# Patient Record
Sex: Female | Born: 1955
Health system: Southern US, Community
[De-identification: ages and names within clinical notes are randomized; demographics above are authoritative.]

## PROBLEM LIST (undated history)

## (undated) DIAGNOSIS — F329 Major depressive disorder, single episode, unspecified: Secondary | ICD-10-CM

## (undated) DIAGNOSIS — Z77120 Contact with and (suspected) exposure to mold (toxic): Secondary | ICD-10-CM

## (undated) DIAGNOSIS — F988 Other specified behavioral and emotional disorders with onset usually occurring in childhood and adolescence: Secondary | ICD-10-CM

## (undated) DIAGNOSIS — E785 Hyperlipidemia, unspecified: Secondary | ICD-10-CM

## (undated) DIAGNOSIS — H269 Unspecified cataract: Secondary | ICD-10-CM

## (undated) DIAGNOSIS — T7840XA Allergy, unspecified, initial encounter: Secondary | ICD-10-CM

## (undated) DIAGNOSIS — G2581 Restless legs syndrome: Secondary | ICD-10-CM

## (undated) DIAGNOSIS — I10 Essential (primary) hypertension: Secondary | ICD-10-CM

## (undated) DIAGNOSIS — F419 Anxiety disorder, unspecified: Secondary | ICD-10-CM

## (undated) DIAGNOSIS — K589 Irritable bowel syndrome without diarrhea: Secondary | ICD-10-CM

## (undated) DIAGNOSIS — F32A Depression, unspecified: Secondary | ICD-10-CM

## (undated) HISTORY — DX: Major depressive disorder, single episode, unspecified: F32.9

## (undated) HISTORY — DX: Depression, unspecified: F32.A

## (undated) HISTORY — DX: Hyperlipidemia, unspecified: E78.5

## (undated) HISTORY — PX: ABDOMINAL HYSTERECTOMY: SHX81

## (undated) HISTORY — DX: Essential (primary) hypertension: I10

## (undated) HISTORY — DX: Contact with and (suspected) exposure to mold (toxic): Z77.120

## (undated) HISTORY — DX: Allergy, unspecified, initial encounter: T78.40XA

## (undated) HISTORY — PX: RHINOPLASTY: SUR1284

## (undated) HISTORY — DX: Unspecified cataract: H26.9

## (undated) HISTORY — DX: Irritable bowel syndrome without diarrhea: K58.9

## (undated) HISTORY — DX: Anxiety disorder, unspecified: F41.9

## (undated) HISTORY — DX: Other specified behavioral and emotional disorders with onset usually occurring in childhood and adolescence: F98.8

## (undated) HISTORY — DX: Restless legs syndrome: G25.81

---
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
## 1998-04-17 ENCOUNTER — Other Ambulatory Visit: Admission: RE | Admit: 1998-04-17 | Discharge: 1998-04-17 | Payer: Self-pay | Admitting: Obstetrics and Gynecology

## 1999-05-22 ENCOUNTER — Other Ambulatory Visit: Admission: RE | Admit: 1999-05-22 | Discharge: 1999-05-22 | Payer: Self-pay | Admitting: Obstetrics and Gynecology

## 1999-08-08 ENCOUNTER — Encounter: Admission: RE | Admit: 1999-08-08 | Discharge: 1999-11-06 | Payer: Self-pay | Admitting: Internal Medicine

## 2000-01-16 ENCOUNTER — Encounter: Admission: RE | Admit: 2000-01-16 | Discharge: 2000-04-15 | Payer: Self-pay | Admitting: Internal Medicine

## 2000-01-31 ENCOUNTER — Encounter: Admission: RE | Admit: 2000-01-31 | Discharge: 2000-01-31 | Payer: Self-pay | Admitting: Internal Medicine

## 2000-01-31 ENCOUNTER — Encounter: Payer: Self-pay | Admitting: Internal Medicine

## 2000-02-18 ENCOUNTER — Encounter: Payer: Self-pay | Admitting: Internal Medicine

## 2000-02-18 ENCOUNTER — Encounter: Admission: RE | Admit: 2000-02-18 | Discharge: 2000-02-18 | Payer: Self-pay | Admitting: Internal Medicine

## 2000-04-01 ENCOUNTER — Other Ambulatory Visit: Admission: RE | Admit: 2000-04-01 | Discharge: 2000-04-01 | Payer: Self-pay | Admitting: Obstetrics and Gynecology

## 2000-05-05 ENCOUNTER — Encounter (INDEPENDENT_AMBULATORY_CARE_PROVIDER_SITE_OTHER): Payer: Self-pay

## 2000-05-05 ENCOUNTER — Inpatient Hospital Stay (HOSPITAL_COMMUNITY): Admission: RE | Admit: 2000-05-05 | Discharge: 2000-05-06 | Payer: Self-pay | Admitting: Obstetrics and Gynecology

## 2004-05-08 ENCOUNTER — Other Ambulatory Visit: Admission: RE | Admit: 2004-05-08 | Discharge: 2004-05-08 | Payer: Self-pay | Admitting: Internal Medicine

## 2005-03-25 HISTORY — PX: FOOT SURGERY: SHX648

## 2005-06-18 ENCOUNTER — Other Ambulatory Visit: Admission: RE | Admit: 2005-06-18 | Discharge: 2005-06-18 | Payer: Self-pay | Admitting: Internal Medicine

## 2006-08-01 ENCOUNTER — Other Ambulatory Visit: Admission: RE | Admit: 2006-08-01 | Discharge: 2006-08-01 | Payer: Self-pay | Admitting: Internal Medicine

## 2007-06-15 ENCOUNTER — Encounter: Admission: RE | Admit: 2007-06-15 | Discharge: 2007-06-15 | Payer: Self-pay | Admitting: Internal Medicine

## 2007-07-01 ENCOUNTER — Ambulatory Visit: Payer: Self-pay | Admitting: Gastroenterology

## 2007-10-07 ENCOUNTER — Telehealth (INDEPENDENT_AMBULATORY_CARE_PROVIDER_SITE_OTHER): Payer: Self-pay | Admitting: *Deleted

## 2007-10-09 ENCOUNTER — Ambulatory Visit: Payer: Self-pay | Admitting: Gastroenterology

## 2007-10-09 LAB — CONVERTED CEMR LAB
ALT: 48 units/L — ABNORMAL HIGH (ref 0–35)
AST: 34 units/L (ref 0–37)
Albumin: 3.9 g/dL (ref 3.5–5.2)
Alkaline Phosphatase: 56 units/L (ref 39–117)
Bilirubin, Direct: 0.1 mg/dL (ref 0.0–0.3)
Total Bilirubin: 0.7 mg/dL (ref 0.3–1.2)
Total Protein: 7.2 g/dL (ref 6.0–8.3)

## 2008-01-19 ENCOUNTER — Ambulatory Visit: Payer: Self-pay | Admitting: Internal Medicine

## 2008-04-07 ENCOUNTER — Ambulatory Visit: Payer: Self-pay | Admitting: Internal Medicine

## 2008-06-27 ENCOUNTER — Ambulatory Visit: Payer: Self-pay | Admitting: Internal Medicine

## 2008-09-29 ENCOUNTER — Ambulatory Visit: Payer: Self-pay | Admitting: Internal Medicine

## 2009-06-30 ENCOUNTER — Ambulatory Visit: Payer: Self-pay | Admitting: Internal Medicine

## 2009-08-17 ENCOUNTER — Ambulatory Visit: Payer: Self-pay | Admitting: Internal Medicine

## 2009-09-22 ENCOUNTER — Encounter: Admission: RE | Admit: 2009-09-22 | Discharge: 2009-09-22 | Payer: Self-pay | Admitting: Internal Medicine

## 2009-10-09 ENCOUNTER — Ambulatory Visit: Payer: Self-pay | Admitting: Internal Medicine

## 2009-11-23 ENCOUNTER — Ambulatory Visit: Payer: Self-pay | Admitting: Internal Medicine

## 2010-08-07 NOTE — Letter (Signed)
July 01, 2007    Sharlet Salina, MD  [address]  [place], [place]  [address]   RE:  DERONDA, CHRISTIAN  MRN:  [id number]  /  DOB:  December 29, 1955   Dear Dr. Lenord Fellers:   Upon your kind referral, I had the pleasure of evaluating your patient  and I am pleased to offer my findings. I saw Ms. Fairbank in the office  today. Enclosed is a copy of my progress note that details my findings  and recommendations.   Thank you for the opportunity to participate in your patient's care.    Sincerely,      Barbette Hair. Arlyce Dice, MD,FACG  Electronically Signed    RDK/MedQ  Shawnia: 07/01/2007  DT: 07/02/2007  Job #: [id number]

## 2010-08-07 NOTE — Letter (Signed)
July 01, 2007    Cuca Jackey Loge   RE:  Crystal Blake, Tortora Davionna  MRN:  [id number]  /  DOB:  1955/07/19   Dear Ms. Stegmann:   It is my pleasure to have treated you recently as a new patient in my  office. I appreciate your confidence and the opportunity to participate  in your care.   Since I do have a busy inpatient endoscopy schedule and office schedule,  my office hours vary weekly. I am, however, available for emergency  calls every day through my office. If I cannot promptly meet an urgent  office appointment, another one of our gastroenterologists will be able  to assist you.   My well-trained staff are prepared to help you at all times. For  emergencies after office hours, a physician from our gastroenterology  section is always available through my 24-hour answering service.   While you are under my care, I encourage discussion of your questions  and concerns, and I will be happy to return your calls as soon as I am  available.   Once again, I welcome you as a new patient and I look forward to a happy  and healthy relationship.    Sincerely,      Barbette Hair. Arlyce Dice, MD,FACG  Electronically Signed   RDK/MedQ  Shontel: 07/01/2007  DT: 07/02/2007  Job #: [id number]

## 2010-08-07 NOTE — Assessment & Plan Note (Signed)
[facility]                         GASTROENTEROLOGY OFFICE NOTE   NAME:Pedro, Nandita Talmadge Coventry                    MRN:          [id number]  DATE:07/01/2007                            DOB:          05-25-1955    REASON FOR CONSULTATION:  Abnormal liver tests and ultrasound.   HISTORY:  Ms. Dumais is a 55 year old white female referred through the  courtesy of Dr. Lenord Fellers for evaluation.  Abnormal liver tests on routine  exam prompted an ultrasound that demonstrated hepatomegaly with fatty  infiltration of the liver.  Liver measured 22 cm.  Ms. Linz has no  history of liver disease, jaundice, hepatitis or IV drug use.  She does  drink wine several nights a week and at times may have up to a bottle of  wine or at least several glasses.  She has also gained weight over the  last several years.  She denies abdominal pain, pruritus or jaundice.  She does complain of severe fatigue.   PAST MEDICAL HISTORY:  1. Hypertension.  2. Hypercholesterolemia.  3. Anxiety and depression.  4. She is status post hysterectomy.   FAMILY HISTORY:  Pertinent for mother with ovarian cancer.   MEDICATIONS:  1. Atenolol.  2. Amphetamine.   ALLERGIES:  NONE.   SOCIAL HISTORY:  She does not smoke.  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   REVIEW OF SYSTEMS:  Positive for fatigue and back pain, otherwise  systems are negative.   PHYSICAL EXAMINATION:  GENERAL:  She is a healthy-appearing female.  There is no stigmata of liver disease.  VITAL SIGNS:  Pulse 96, blood pressure 124/82, weight 186.  HEENT:  EOMI.  PERRLA.  Sclerae are anicteric.  Conjunctivae are pink.  NECK:  Supple without thyromegaly, adenopathy or carotid bruits.  CHEST:  Clear to auscultation and percussion without adventitious  sounds.  CARDIAC:  Regular rhythm; normal S1 S2.  There are no murmurs, gallops  or rubs.  ABDOMEN:  Liver is palpable at the right costal margin.  Hyperacusis to  approximately 15 cm.  There are no abdominal masses or splenomegaly.  The remainder of the exam is normal.  EXTREMITIES:  Full range of motion.  No cyanosis, clubbing or edema.  RECTAL:  Deferred.  SKELETAL:  There are no deformities.  NEUROLOGIC:  There is no asterixis.  She is alert and oriented x3.  There are no focal abnormalities.   DIAGNOSTICS:  Review of the ultrasound demonstrates an echogenic liver  with diffuse hepatic enlargement measuring 22 cm.   IMPRESSION:  Hepatosteatosis.  Her hepatic enlargement and  hepatosteatosis may be due to a combination of her obesity and alcohol  use.  I believe that she does have significant alcohol intake which may  be contributing if not causing her symptoms of her hepatic enlargement.  Liver enzyme abnormalities (results are not available at this time) are  likely related to this.   RECOMMENDATION:  1. I advised Ms. Santori to discontinue all alcohol intake.  Ideally,      she will lose about 40 pounds.  2. Check iron TIBC, ferritin, AMA, ANA,  alpha I antitrypsin levels,      ceruloplasmin level.  I will check her serologies per Dr. Lenord Fellers.  3. Follow up LFTs in office visit in approximately 3 months.     Barbette Hair. Arlyce Dice, MD,FACG  Electronically Signed    RDK/MedQ  Oda: 07/01/2007  DT: 07/01/2007  Job #: [id number]   cc:   Luanna Cole. Lenord Fellers, M.D.

## 2010-08-10 NOTE — Op Note (Signed)
[facility]  Patient:    Crystal Blake, Crystal Blake                           MRN: [id number] Proc. Date: 05/05/00 Attending:  Aram Beecham P. Ashley Royalty, M.D.                           Operative Report  PREOPERATIVE DIAGNOSES: 1. Menorrhagia and dysmenorrhea unresponsive to medical management. 2. Small uterine fibroid.  POSTOPERATIVE DIAGNOSES: 1. Menorrhagia and dysmenorrhea unresponsive to medical management. 2. Small uterine fibroid.  PROCEDURE:  Total vaginal hysterectomy.  SURGEON:  Cynthia P. Ashley Royalty, M.D.  ASSISTANT:  Andres Ege, M.D.  ANESTHESIA:  General endotracheal anesthesia.  ESTIMATED BLOOD LOSS:  100 cc.  COMPLICATIONS:  None.  DESCRIPTION OF PROCEDURE:  The patient was taken to the operating room and, after the induction of adequate general endotracheal anesthesia, was placed in the dorsolithotomy position and prepped and draped in the usual fashion.  The bladder as drained with a red rubber catheter.  The cervix was grasped on its anterior lip with a Jacobs tenaculum, and 10 cc of 1% Xylocaine with epinephrine were injected submucosally.  A knife was used to excise the vaginal mucosa over the cervix, and this was pushed back with a sponge.  A posterior colpotomy incision was made, and the Bonnano retractor placed in the peritoneal space.  The uterosacral ligaments were clamped with Heaney Ballantine, cut, and sutured with #1 Vicryl.  The cardinal ligament was clamped on each side with a Heaney Ballantine, clamping, cutting, and tying in sequence.  The uterine arteries were clamped, cut, and doubly tied with the O Vicryl.  The fundus was then delivered through the posterior cul-de-sac and the pedicle containing the tube, the utero-ovarian ligament, the round ligament was doubly clamped, cut, and doubly tied with 0 Vicryl.  Inspection of the pedicle revealed there to be no bleeding.  The vaginal cuff was then run with 0 Vicryl incorporating  the peritoneum for hemostasis.  An anti-enterocele stitch was then placed by entering the posterior peritoneal space in the midline, grabbing the left uterosacral ligament with the stitch, skimming over the peritoneum, grabbing the right uterosacral ligament, and then coming out again in the midline and tying.  The pedicles were again inspected and were hemostatic.  The surgical site was irrigated, and irrigant was clear.  The vagina was closed with interrupted sutures of 0 Vicryl.  A Foley catheter was inserted, and the procedure was terminated.  The patient tolerated it well and went in satisfactory condition to postanesthesia recovery. Rhaya:  05/05/00 TD:  05/06/00 Job: [id number]

## 2010-10-04 ENCOUNTER — Encounter: Payer: Self-pay | Admitting: Internal Medicine

## 2010-10-05 ENCOUNTER — Encounter: Payer: Self-pay | Admitting: Internal Medicine

## 2010-10-08 ENCOUNTER — Ambulatory Visit (INDEPENDENT_AMBULATORY_CARE_PROVIDER_SITE_OTHER): Payer: 59 | Admitting: Internal Medicine

## 2010-10-08 ENCOUNTER — Encounter: Payer: Self-pay | Admitting: Internal Medicine

## 2010-10-08 DIAGNOSIS — IMO0001 Reserved for inherently not codable concepts without codable children: Secondary | ICD-10-CM

## 2010-10-08 DIAGNOSIS — F988 Other specified behavioral and emotional disorders with onset usually occurring in childhood and adolescence: Secondary | ICD-10-CM

## 2010-10-08 DIAGNOSIS — E785 Hyperlipidemia, unspecified: Secondary | ICD-10-CM

## 2010-10-08 DIAGNOSIS — E669 Obesity, unspecified: Secondary | ICD-10-CM

## 2010-10-08 DIAGNOSIS — I10 Essential (primary) hypertension: Secondary | ICD-10-CM

## 2010-10-08 DIAGNOSIS — M7918 Myalgia, other site: Secondary | ICD-10-CM

## 2010-10-17 ENCOUNTER — Other Ambulatory Visit: Payer: Self-pay | Admitting: Internal Medicine

## 2010-10-22 DIAGNOSIS — M7918 Myalgia, other site: Secondary | ICD-10-CM | POA: Insufficient documentation

## 2010-10-22 DIAGNOSIS — E785 Hyperlipidemia, unspecified: Secondary | ICD-10-CM | POA: Insufficient documentation

## 2010-10-22 DIAGNOSIS — E669 Obesity, unspecified: Secondary | ICD-10-CM | POA: Insufficient documentation

## 2010-10-22 DIAGNOSIS — F988 Other specified behavioral and emotional disorders with onset usually occurring in childhood and adolescence: Secondary | ICD-10-CM | POA: Insufficient documentation

## 2010-10-22 DIAGNOSIS — I10 Essential (primary) hypertension: Secondary | ICD-10-CM | POA: Insufficient documentation

## 2010-10-22 NOTE — Progress Notes (Signed)
  Subjective:    Patient ID: Crystal Blake, female    DOB: [birth year], 55 y.o.   MRN: [id number]  HPI white female with history of asthma, allergic rhinitis, anxiety depression, hyperlipidemia, hypertension. Complains of restless leg syndrome and takes Requip. History of rhinoplasty 1978. Vaginal hysterectomy 2002. Has been tried on Lipitor which she did not tolerate because of musculoskeletal pain. Suggest Crestor. She's under a lot of stress. Husband has had some respiratory issues and has been out of work a lot. Mother low has pancreatic cancer. Her daughter has had some mental issues and was out of school a lot this past year. In September 2011 she had done lipid panel that was essentially normal except for triglycerides of 215. Occasionally has trigger points injected along scapular area. Has been off and on cholesterol medication for years. Has issues centering around organization of her home getting mundane chores done. Has trouble getting up in the morning. This has been going on for years. She has worked previously as a Museum/gallery conservator and works some from home but has not held a steady job other than working from home for several years.  She is very intelligent and attended college at [employer].    Review of Systems     Objective:   Physical Exam chest clear; cardiac exam regular rate and rhythm; extremities without edema        Assessment & Plan:   Hypertension  Hyperlipidemia  Depression  Restless leg syndrome  Attention deficit disorder  Asthma  Allergic rhinitis.  Is asking for mail order prescription for Adderall. This is been prescribed previously by psychiatrist and we have continued her on the signs prescription for Adderall 20 mg 3 times daily. Given #270 with 1 refill. Would like for her to take better care of herself and try the diet exercise and lose weight.

## 2010-11-22 ENCOUNTER — Other Ambulatory Visit: Payer: 59 | Admitting: Internal Medicine

## 2010-11-22 DIAGNOSIS — Z0000 Encounter for general adult medical examination without abnormal findings: Secondary | ICD-10-CM

## 2010-11-22 LAB — CBC WITH DIFFERENTIAL/PLATELET
Basophils Absolute: 0 10*3/uL (ref 0.0–0.1)
Basophils Relative: 1 % (ref 0–1)
HCT: 46.4 % — ABNORMAL HIGH (ref 36.0–46.0)
Hemoglobin: 14.4 g/dL (ref 12.0–15.0)
Lymphocytes Relative: 38 % (ref 12–46)
MCHC: 31 g/dL (ref 30.0–36.0)
Monocytes Absolute: 0.5 10*3/uL (ref 0.1–1.0)
Monocytes Relative: 7 % (ref 3–12)
Neutro Abs: 3.9 10*3/uL (ref 1.7–7.7)
Neutrophils Relative %: 52 % (ref 43–77)
WBC: 7.7 10*3/uL (ref 4.0–10.5)

## 2010-11-22 LAB — LIPID PANEL
Cholesterol: 262 mg/dL — ABNORMAL HIGH (ref 0–200)
HDL: 49 mg/dL (ref >39)
LDL Cholesterol: 140 mg/dL — ABNORMAL HIGH (ref 0–99)
Triglycerides: 365 mg/dL — ABNORMAL HIGH (ref <150)
VLDL: 73 mg/dL — ABNORMAL HIGH (ref 0–40)

## 2010-11-22 LAB — COMPREHENSIVE METABOLIC PANEL
BUN: 16 mg/dL (ref 6–23)
CO2: 25 mEq/L (ref 19–32)
Calcium: 9.7 mg/dL (ref 8.4–10.5)
Chloride: 103 mEq/L (ref 96–112)
Creat: 0.73 mg/dL (ref 0.50–1.10)
Total Bilirubin: 0.5 mg/dL (ref 0.3–1.2)

## 2010-11-22 LAB — TSH: TSH: 1.771 u[IU]/mL (ref 0.350–4.500)

## 2010-11-23 ENCOUNTER — Encounter: Payer: Self-pay | Admitting: Internal Medicine

## 2010-11-23 ENCOUNTER — Ambulatory Visit (INDEPENDENT_AMBULATORY_CARE_PROVIDER_SITE_OTHER): Payer: 59 | Admitting: Internal Medicine

## 2010-11-23 VITALS — BP 156/94 | HR 84 | Temp 99.4°F | Ht 66.25 in | Wt 177.0 lb

## 2010-11-23 DIAGNOSIS — F411 Generalized anxiety disorder: Secondary | ICD-10-CM

## 2010-11-23 DIAGNOSIS — J45909 Unspecified asthma, uncomplicated: Secondary | ICD-10-CM

## 2010-11-23 DIAGNOSIS — E669 Obesity, unspecified: Secondary | ICD-10-CM

## 2010-11-23 DIAGNOSIS — F329 Major depressive disorder, single episode, unspecified: Secondary | ICD-10-CM

## 2010-11-23 DIAGNOSIS — F419 Anxiety disorder, unspecified: Secondary | ICD-10-CM

## 2010-11-23 DIAGNOSIS — F32A Depression, unspecified: Secondary | ICD-10-CM

## 2010-11-23 DIAGNOSIS — I10 Essential (primary) hypertension: Secondary | ICD-10-CM

## 2010-11-23 DIAGNOSIS — E785 Hyperlipidemia, unspecified: Secondary | ICD-10-CM

## 2010-11-23 DIAGNOSIS — G2581 Restless legs syndrome: Secondary | ICD-10-CM

## 2010-11-23 DIAGNOSIS — F988 Other specified behavioral and emotional disorders with onset usually occurring in childhood and adolescence: Secondary | ICD-10-CM

## 2010-11-23 DIAGNOSIS — Z0000 Encounter for general adult medical examination without abnormal findings: Secondary | ICD-10-CM

## 2010-11-23 DIAGNOSIS — J309 Allergic rhinitis, unspecified: Secondary | ICD-10-CM

## 2010-11-23 LAB — POCT URINALYSIS DIPSTICK
Bilirubin, UA: NEGATIVE
Blood, UA: NEGATIVE
Glucose, UA: NEGATIVE
Leukocytes, UA: NEGATIVE
Nitrite, UA: NEGATIVE

## 2010-12-24 ENCOUNTER — Encounter: Payer: Self-pay | Admitting: Internal Medicine

## 2010-12-24 DIAGNOSIS — J309 Allergic rhinitis, unspecified: Secondary | ICD-10-CM | POA: Insufficient documentation

## 2010-12-24 DIAGNOSIS — F329 Major depressive disorder, single episode, unspecified: Secondary | ICD-10-CM | POA: Insufficient documentation

## 2010-12-24 DIAGNOSIS — F32A Depression, unspecified: Secondary | ICD-10-CM | POA: Insufficient documentation

## 2010-12-24 DIAGNOSIS — F419 Anxiety disorder, unspecified: Secondary | ICD-10-CM | POA: Insufficient documentation

## 2010-12-24 DIAGNOSIS — G2581 Restless legs syndrome: Secondary | ICD-10-CM | POA: Insufficient documentation

## 2010-12-24 NOTE — Progress Notes (Signed)
Subjective:    Patient ID: Crystal Blake, female    DOB: Apr 27, 1955, 55 y.o.   MRN: [id number]  HPI 55 year old white female with history of hypertension, asthma, anxiety, depression, restless leg syndrome, hyperlipidemia, thyroid cyst status post rhinoplasty 1978, vaginal hysterectomy 2002 for evaluation of medical problems. Had tetanus immunization 2002. History of attention deficit disorder diagnosed by psychiatrist. Has been a patient in this practice since 1987. Had a suicidal gesture at 55 years of age. Has never dealt well with work stress. Former smoker quit in the late 1980s. History of migraine headache. History of allergic rhinitis. Had benign cyst removed from left upper arm 1972. Had an abortion 1979. Severe depression 1980. Social alcohol consumption.  Father died with liver cancer. Mother with history of hypertension , hyperlipidemia, and alcoholism  Patient has a college degree from [employer] in [place] literature. 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 Difficulty getting out of bed in the morning. Frequently disorganized with chores. Married with 2 daughters. Husband is a Community education officer at [employer] and formerly worked as a Control and instrumentation engineer. They have struggle financially over the past several years. Youngest daughter has had some mental health issues and school truancy issues.    Review of Systems  Constitutional: Positive for fatigue.       Obesity and lack of exercise  HENT: Positive for rhinorrhea and sneezing.   Eyes: Negative.   Respiratory:       History of asthma  Cardiovascular: Negative.   Gastrointestinal: Negative.   Genitourinary: Negative.   Musculoskeletal:       Musculoskeletal pain  Skin: Negative.   Neurological: Negative.   Hematological: Negative.   Psychiatric/Behavioral: Positive for dysphoric mood and decreased concentration. Negative for suicidal ideas. The patient is nervous/anxious.         Objective:   Physical Exam  Vitals reviewed. Constitutional: She is oriented to person, place, and time. She appears well-nourished.  HENT:  Head: Normocephalic and atraumatic.  Right Ear: External ear normal.  Left Ear: External ear normal.  Mouth/Throat: Oropharynx is clear and moist. No oropharyngeal exudate.  Eyes: EOM are normal. Pupils are equal, round, and reactive to light. Right eye exhibits no discharge. Left eye exhibits no discharge. No scleral icterus.  Neck: Neck supple. No JVD present. No thyromegaly present.  Cardiovascular: Normal rate, regular rhythm and normal heart sounds.   No murmur heard. Pulmonary/Chest: Effort normal and breath sounds normal. She has no rales.       Breasts normal female  Abdominal: Soft. Bowel sounds are normal. She exhibits no mass. There is no tenderness.  Genitourinary:       Deferred  Musculoskeletal: Normal range of motion. She exhibits no edema.  Lymphadenopathy:    She has no cervical adenopathy.  Neurological: She is alert and oriented to person, place, and time. She has normal reflexes. No cranial nerve deficit.  Skin: Skin is warm and dry.  Psychiatric: Judgment and thought content normal.          Assessment & Plan:  History of depression  History of asthma  History of allergic rhinitis  History of anxiety  History of attention deficit disorder  History of restless leg syndrome  History of hypertension  History of hyperlipidemia  History of musculoskeletal pain  Patient has not been all that compliant with lipid-lowering medication because of expense. Needs to restart statin medication. Has complained Lipitor caused musculoskeletal  pain. Try Crestor. Patient needs to be saying to 6 months for followup on hyperlipidemia.

## 2010-12-28 ENCOUNTER — Other Ambulatory Visit: Payer: Self-pay

## 2010-12-28 MED ORDER — AMPHETAMINE-DEXTROAMPHETAMINE 20 MG PO TABS: 20.0000 mg | ORAL_TABLET | Freq: Three times a day (TID) | ORAL | Status: DC

## 2011-03-23 ENCOUNTER — Other Ambulatory Visit: Payer: Self-pay | Admitting: Internal Medicine

## 2011-04-02 ENCOUNTER — Other Ambulatory Visit: Payer: Self-pay

## 2011-04-02 MED ORDER — AMPHETAMINE-DEXTROAMPHETAMINE 20 MG PO TABS: 20.0000 mg | ORAL_TABLET | Freq: Three times a day (TID) | ORAL | Status: DC

## 2011-05-30 ENCOUNTER — Other Ambulatory Visit: Payer: 59 | Admitting: Internal Medicine

## 2011-05-30 DIAGNOSIS — E785 Hyperlipidemia, unspecified: Secondary | ICD-10-CM

## 2011-05-30 DIAGNOSIS — Z79899 Other long term (current) drug therapy: Secondary | ICD-10-CM

## 2011-05-30 LAB — HEPATIC FUNCTION PANEL
ALT: 38 U/L — ABNORMAL HIGH (ref 0–35)
AST: 23 U/L (ref 0–37)
Albumin: 4.7 g/dL (ref 3.5–5.2)
Alkaline Phosphatase: 65 U/L (ref 39–117)
Total Protein: 7.2 g/dL (ref 6.0–8.3)

## 2011-05-30 LAB — LIPID PANEL
Cholesterol: 207 mg/dL — ABNORMAL HIGH (ref 0–200)
HDL: 61 mg/dL (ref >39)
Triglycerides: 173 mg/dL — ABNORMAL HIGH (ref <150)

## 2011-05-31 ENCOUNTER — Encounter: Payer: Self-pay | Admitting: Internal Medicine

## 2011-05-31 ENCOUNTER — Ambulatory Visit (INDEPENDENT_AMBULATORY_CARE_PROVIDER_SITE_OTHER): Payer: 59 | Admitting: Internal Medicine

## 2011-05-31 VITALS — BP 124/86 | HR 80 | Temp 98.2°F | Wt 180.0 lb

## 2011-05-31 DIAGNOSIS — R5381 Other malaise: Secondary | ICD-10-CM

## 2011-05-31 DIAGNOSIS — F439 Reaction to severe stress, unspecified: Secondary | ICD-10-CM

## 2011-05-31 DIAGNOSIS — I10 Essential (primary) hypertension: Secondary | ICD-10-CM

## 2011-05-31 DIAGNOSIS — R5383 Other fatigue: Secondary | ICD-10-CM

## 2011-05-31 DIAGNOSIS — E785 Hyperlipidemia, unspecified: Secondary | ICD-10-CM

## 2011-05-31 DIAGNOSIS — G2581 Restless legs syndrome: Secondary | ICD-10-CM

## 2011-05-31 DIAGNOSIS — F988 Other specified behavioral and emotional disorders with onset usually occurring in childhood and adolescence: Secondary | ICD-10-CM

## 2011-05-31 DIAGNOSIS — Z8709 Personal history of other diseases of the respiratory system: Secondary | ICD-10-CM

## 2011-05-31 DIAGNOSIS — Z733 Stress, not elsewhere classified: Secondary | ICD-10-CM

## 2011-06-02 DIAGNOSIS — F439 Reaction to severe stress, unspecified: Secondary | ICD-10-CM | POA: Insufficient documentation

## 2011-06-02 DIAGNOSIS — R5383 Other fatigue: Secondary | ICD-10-CM | POA: Insufficient documentation

## 2011-06-02 NOTE — Progress Notes (Signed)
  Subjective:    Patient ID: Crystal Blake, female    DOB: 1955/08/22, 56 y.o.   MRN: [id number]  HPI Patient with history of hyperlipidemia, hypertension, attention deficit and fatigue for six-month recheck. She is married and has 2 teenage daughters. Youngest daughter is had some issues requiring hospitalization at behavioral health. Daughter is doing much better now; but  had suicidal gesture requiring admission to behavioral health a few months ago. Seems to be doing better at [employer]. Patient's husband works at [employer]. He currently is a Community education officer but will be moving to a position in service department which did give him some more time at home. Patient's mother apparently has had issues with drug addiction and dependence on Vicodin. Was hospitalized and is now in [place] here in [place].    Review of Systems     Objective:   Physical Exam neck supple no thyromegaly; chest clear; cardiac exam regular rate and rhythm; extremities without edema        Assessment & Plan:  Hypertension  Hyper lipidemia  Attention deficit  History of chronic fatigue  Obesity  Chronic situational stress  Plan: Patient is to return in 6 months for physical examination. No changes in medication. Refill Requip for one year.

## 2011-06-02 NOTE — Patient Instructions (Signed)
Continue same medications and return in 6 months. Try to exercise and lose weight.

## 2011-06-14 ENCOUNTER — Other Ambulatory Visit: Payer: Self-pay | Admitting: Internal Medicine

## 2011-06-21 ENCOUNTER — Other Ambulatory Visit: Payer: Self-pay | Admitting: Internal Medicine

## 2011-07-04 ENCOUNTER — Other Ambulatory Visit: Payer: Self-pay

## 2011-07-04 MED ORDER — AMPHETAMINE-DEXTROAMPHETAMINE 20 MG PO TABS: 20.0000 mg | ORAL_TABLET | Freq: Three times a day (TID) | ORAL | Status: DC

## 2011-09-13 ENCOUNTER — Other Ambulatory Visit: Payer: Self-pay | Admitting: Internal Medicine

## 2011-10-03 ENCOUNTER — Other Ambulatory Visit: Payer: Self-pay

## 2011-10-03 MED ORDER — AMPHETAMINE-DEXTROAMPHETAMINE 20 MG PO TABS: 20.0000 mg | ORAL_TABLET | Freq: Three times a day (TID) | ORAL | Status: DC

## 2011-10-10 ENCOUNTER — Encounter: Payer: Self-pay | Admitting: Internal Medicine

## 2011-10-10 ENCOUNTER — Ambulatory Visit (INDEPENDENT_AMBULATORY_CARE_PROVIDER_SITE_OTHER): Payer: 59 | Admitting: Internal Medicine

## 2011-10-10 VITALS — BP 156/100 | HR 88 | Temp 99.0°F | Wt 177.5 lb

## 2011-10-10 DIAGNOSIS — I10 Essential (primary) hypertension: Secondary | ICD-10-CM

## 2011-10-10 DIAGNOSIS — F411 Generalized anxiety disorder: Secondary | ICD-10-CM

## 2011-10-10 DIAGNOSIS — F419 Anxiety disorder, unspecified: Secondary | ICD-10-CM

## 2011-10-10 DIAGNOSIS — M7918 Myalgia, other site: Secondary | ICD-10-CM

## 2011-10-10 DIAGNOSIS — F988 Other specified behavioral and emotional disorders with onset usually occurring in childhood and adolescence: Secondary | ICD-10-CM

## 2011-10-10 DIAGNOSIS — F32A Depression, unspecified: Secondary | ICD-10-CM

## 2011-10-10 DIAGNOSIS — E669 Obesity, unspecified: Secondary | ICD-10-CM

## 2011-10-10 DIAGNOSIS — F430 Acute stress reaction: Secondary | ICD-10-CM

## 2011-10-10 DIAGNOSIS — E785 Hyperlipidemia, unspecified: Secondary | ICD-10-CM

## 2011-10-10 DIAGNOSIS — F439 Reaction to severe stress, unspecified: Secondary | ICD-10-CM

## 2011-10-10 DIAGNOSIS — F329 Major depressive disorder, single episode, unspecified: Secondary | ICD-10-CM

## 2011-10-10 DIAGNOSIS — IMO0001 Reserved for inherently not codable concepts without codable children: Secondary | ICD-10-CM

## 2011-10-14 NOTE — Patient Instructions (Addendum)
Take Tranxene 7.5 mg sparingly for anxiety twice daily. Lexapro 10 mg daily for anxiety depression. Take Vicodin sparingly for pain.

## 2011-10-14 NOTE — Progress Notes (Signed)
  Subjective:    Patient ID: Crystal Blake, female    DOB: 12/08/1955, 56 y.o.   MRN: [id number]  HPI 56 year old white female in today previously diagnosed by Dr. Jodi Marble years ago with attention deficit disorder. She is not hyperactive. She's been on Adderall for many years. She in her family had considerable financial problems over the years. Husband lost his job at the [employer] and began working as a Community education officer. He is now in charge of writing up rep[airs at the same car dealership. Youngest daughter has had some mental issues as well. She cuts herself and is under the care of Dr. Toni Arthurs. Patient says that she worries every month she will not have enough money to make the house payment. Says she always seems anxious. She is to take Tranxene in the remote past which worked well. She has expertise in computers but currently is not working outside the home. 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 Husband recently was charged with DUI.  Also having issues with musculoskeletal pain. Her feet hurt and she is considering having foot surgery in the near future.  History of hypertension, obesity, hyperlipidemia.    Review of Systems     Objective:   Physical Exam neck is supple without thyromegaly. Chest clear to auscultation. Cardiac exam regular rate and rhythm. Patient is alert and oriented x3. Cranial nerves II through XII grossly intact. Affect is slightly anxious. Mood is slightly dysphoric.        Assessment & Plan:  Attention deficit disorder  Anxiety  Probable depression  Obesity  Hypertension-not well controlled today because of anxiety  Hyperlipidemia  Plan: Tranxene 7.5 mg one by mouth twice daily as needed for anxiety  Vicodin 5/500 (#60) 1 by mouth Q8 hours sparingly as needed for foot pain/musculoskeletal pain. One refill given on Vicodin prescription. Recently refilled her Adderall but she has changed prescription mail order companies.  Lexapro  10 mg one tablet daily with 1 refill.  She should consider some low-cost counseling.  Spent 30 minutes talking with patient about these issues

## 2011-12-03 ENCOUNTER — Other Ambulatory Visit: Payer: Self-pay | Admitting: Internal Medicine

## 2011-12-03 ENCOUNTER — Other Ambulatory Visit: Payer: 59 | Admitting: Internal Medicine

## 2011-12-03 DIAGNOSIS — E785 Hyperlipidemia, unspecified: Secondary | ICD-10-CM

## 2011-12-03 DIAGNOSIS — Z0000 Encounter for general adult medical examination without abnormal findings: Secondary | ICD-10-CM

## 2011-12-03 DIAGNOSIS — Z79899 Other long term (current) drug therapy: Secondary | ICD-10-CM

## 2011-12-03 LAB — COMPREHENSIVE METABOLIC PANEL
ALT: 33 U/L (ref 0–35)
Albumin: 4.7 g/dL (ref 3.5–5.2)
CO2: 26 mEq/L (ref 19–32)
Calcium: 9.8 mg/dL (ref 8.4–10.5)
Chloride: 103 mEq/L (ref 96–112)
Glucose, Bld: 97 mg/dL (ref 70–99)
Potassium: 4.9 mEq/L (ref 3.5–5.3)
Sodium: 140 mEq/L (ref 135–145)
Total Bilirubin: 0.6 mg/dL (ref 0.3–1.2)
Total Protein: 7.3 g/dL (ref 6.0–8.3)

## 2011-12-03 LAB — CBC WITH DIFFERENTIAL/PLATELET
Eosinophils Absolute: 0.2 10*3/uL (ref 0.0–0.7)
Hemoglobin: 14.6 g/dL (ref 12.0–15.0)
Lymphocytes Relative: 41 % (ref 12–46)
Lymphs Abs: 3.1 10*3/uL (ref 0.7–4.0)
MCH: 29 pg (ref 26.0–34.0)
Monocytes Relative: 8 % (ref 3–12)
Neutrophils Relative %: 47 % (ref 43–77)
RBC: 5.04 MIL/uL (ref 3.87–5.11)
WBC: 7.6 10*3/uL (ref 4.0–10.5)

## 2011-12-03 LAB — LIPID PANEL
Cholesterol: 193 mg/dL (ref 0–200)
Triglycerides: 296 mg/dL — ABNORMAL HIGH (ref <150)

## 2011-12-04 LAB — VITAMIN D 25 HYDROXY (VIT D DEFICIENCY, FRACTURES): Vit D, 25-Hydroxy: 32 ng/mL (ref 30–89)

## 2011-12-05 ENCOUNTER — Encounter: Payer: Self-pay | Admitting: Internal Medicine

## 2011-12-05 ENCOUNTER — Ambulatory Visit (INDEPENDENT_AMBULATORY_CARE_PROVIDER_SITE_OTHER): Payer: 59 | Admitting: Internal Medicine

## 2011-12-05 VITALS — BP 142/90 | HR 76 | Temp 98.9°F | Ht 67.0 in | Wt 174.0 lb

## 2011-12-05 DIAGNOSIS — I10 Essential (primary) hypertension: Secondary | ICD-10-CM

## 2011-12-05 DIAGNOSIS — Z8709 Personal history of other diseases of the respiratory system: Secondary | ICD-10-CM

## 2011-12-05 DIAGNOSIS — K7689 Other specified diseases of liver: Secondary | ICD-10-CM

## 2011-12-05 DIAGNOSIS — F32A Depression, unspecified: Secondary | ICD-10-CM

## 2011-12-05 DIAGNOSIS — Z23 Encounter for immunization: Secondary | ICD-10-CM

## 2011-12-05 DIAGNOSIS — K760 Fatty (change of) liver, not elsewhere classified: Secondary | ICD-10-CM

## 2011-12-05 DIAGNOSIS — G2581 Restless legs syndrome: Secondary | ICD-10-CM

## 2011-12-05 DIAGNOSIS — F419 Anxiety disorder, unspecified: Secondary | ICD-10-CM

## 2011-12-05 DIAGNOSIS — F341 Dysthymic disorder: Secondary | ICD-10-CM

## 2011-12-05 DIAGNOSIS — F988 Other specified behavioral and emotional disorders with onset usually occurring in childhood and adolescence: Secondary | ICD-10-CM

## 2011-12-05 DIAGNOSIS — E785 Hyperlipidemia, unspecified: Secondary | ICD-10-CM

## 2011-12-05 LAB — POCT URINALYSIS DIPSTICK
Bilirubin, UA: NEGATIVE
Glucose, UA: NEGATIVE
Ketones, UA: NEGATIVE
Leukocytes, UA: NEGATIVE
Nitrite, UA: NEGATIVE
pH, UA: 5.5

## 2011-12-05 LAB — HEMOGLOBIN A1C: Hgb A1c MFr Bld: 5.7 % — ABNORMAL HIGH (ref <5.7)

## 2011-12-06 ENCOUNTER — Other Ambulatory Visit: Payer: Self-pay | Admitting: Internal Medicine

## 2011-12-09 ENCOUNTER — Encounter: Payer: Self-pay | Admitting: Internal Medicine

## 2011-12-09 ENCOUNTER — Ambulatory Visit (INDEPENDENT_AMBULATORY_CARE_PROVIDER_SITE_OTHER): Payer: 59 | Admitting: Internal Medicine

## 2011-12-09 VITALS — BP 138/94 | HR 80 | Temp 98.6°F | Ht 67.0 in | Wt 174.0 lb

## 2011-12-09 DIAGNOSIS — L219 Seborrheic dermatitis, unspecified: Secondary | ICD-10-CM

## 2011-12-09 DIAGNOSIS — L0390 Cellulitis, unspecified: Secondary | ICD-10-CM

## 2011-12-09 DIAGNOSIS — L0291 Cutaneous abscess, unspecified: Secondary | ICD-10-CM

## 2011-12-09 NOTE — Progress Notes (Signed)
  Subjective:    Patient ID: Crystal Blake, female    DOB: [birth year], 56 y.o.   MRN: [id number]  HPI Patient was here last week for physical examination and had cerumen in her ears. I attempted to remove cerumen from both ear canals with curette. She says that yesterday she noticed soreness around her left ear and behind her left ear. She thought it was related to the curetting that was done on 12/05/2011. She has history of seborrhea of external ears. She uses a steroid ointment on those areas. No fever. Says pain is worse when lying on her left side. She says she was worried there could of been some damage to her eardrum from curetting. She even suggested to the front office staff that she should not have to pay a Co-pay because she thought it was related to a curetting injury.    Review of Systems     Objective:   Physical Exam the left external ear canal is free of trauma. Left tympanic membrane is not damaged and is slightly full with good light reflex. What she appears to have is a mild early cellulitis of left external ear along with significant scaling of the left external ear.        Assessment & Plan:  Cellulitis left external ear canal. Has scaly areas on external ear for which she uses cortisone ointment.  Seborrhea left external ear  No evidence of trauma to left tympanic membrane or ear canal  Cerumen left external ear canal  Plan: Cortisporin Otic suspension 4 drops left external ear 4 times a day for 5-7 days. Duricef 500 mg #14 1 by mouth twice a day for 7 days generic. No more attempts will be made to remove cerumen from her ears

## 2011-12-09 NOTE — Patient Instructions (Addendum)
Take Duricef 500 mg twice daily for 7 days. Use Cortisporin Otic suspension in left external ear canal 4 times daily. Call if not better in 48 hours.

## 2012-01-07 ENCOUNTER — Other Ambulatory Visit: Payer: Self-pay

## 2012-01-07 MED ORDER — ATORVASTATIN CALCIUM 40 MG PO TABS: 40.0000 mg | ORAL_TABLET | Freq: Every day | ORAL | Status: DC

## 2012-01-07 MED ORDER — AMPHETAMINE-DEXTROAMPHETAMINE 20 MG PO TABS: 20.0000 mg | ORAL_TABLET | Freq: Three times a day (TID) | ORAL | Status: DC

## 2012-01-13 ENCOUNTER — Other Ambulatory Visit: Payer: Self-pay

## 2012-01-13 MED ORDER — ATORVASTATIN CALCIUM 40 MG PO TABS: 40.0000 mg | ORAL_TABLET | Freq: Every day | ORAL | Status: DC

## 2012-01-13 MED ORDER — AMPHETAMINE-DEXTROAMPHETAMINE 20 MG PO TABS: 20.0000 mg | ORAL_TABLET | Freq: Three times a day (TID) | ORAL | Status: DC

## 2012-05-11 ENCOUNTER — Ambulatory Visit (INDEPENDENT_AMBULATORY_CARE_PROVIDER_SITE_OTHER): Payer: BC Managed Care – PPO | Admitting: Internal Medicine

## 2012-05-11 ENCOUNTER — Encounter: Payer: Self-pay | Admitting: Internal Medicine

## 2012-05-11 VITALS — BP 138/98 | Temp 98.6°F | Wt 181.0 lb

## 2012-05-11 DIAGNOSIS — F411 Generalized anxiety disorder: Secondary | ICD-10-CM

## 2012-05-11 DIAGNOSIS — F32A Depression, unspecified: Secondary | ICD-10-CM

## 2012-05-11 DIAGNOSIS — F329 Major depressive disorder, single episode, unspecified: Secondary | ICD-10-CM

## 2012-05-11 DIAGNOSIS — F988 Other specified behavioral and emotional disorders with onset usually occurring in childhood and adolescence: Secondary | ICD-10-CM

## 2012-05-11 DIAGNOSIS — G2581 Restless legs syndrome: Secondary | ICD-10-CM

## 2012-05-11 DIAGNOSIS — E785 Hyperlipidemia, unspecified: Secondary | ICD-10-CM

## 2012-05-11 DIAGNOSIS — E119 Type 2 diabetes mellitus without complications: Secondary | ICD-10-CM

## 2012-05-11 DIAGNOSIS — I10 Essential (primary) hypertension: Secondary | ICD-10-CM

## 2012-05-11 MED ORDER — AMPHETAMINE-DEXTROAMPHETAMINE 30 MG PO TABS: 30.0000 mg | ORAL_TABLET | Freq: Two times a day (BID) | ORAL | Status: DC

## 2012-05-11 MED ORDER — ATENOLOL 50 MG PO TABS: 50.0000 mg | ORAL_TABLET | Freq: Every day | ORAL | Status: DC

## 2012-05-11 MED ORDER — ROPINIROLE HCL 1 MG PO TABS: 1.0000 mg | ORAL_TABLET | Freq: Every day | ORAL | Status: DC

## 2012-05-11 MED ORDER — CLORAZEPATE DIPOTASSIUM 3.75 MG PO TABS: 3.7500 mg | ORAL_TABLET | Freq: Two times a day (BID) | ORAL | Status: DC | PRN

## 2012-05-11 MED ORDER — ATORVASTATIN CALCIUM 40 MG PO TABS: 40.0000 mg | ORAL_TABLET | Freq: Every day | ORAL | Status: DC

## 2012-05-11 NOTE — Progress Notes (Signed)
  Subjective:    Patient ID: Crystal Blake, female    DOB: [birth year], 57 y.o.   MRN: [id number]  HPI 57 year old white obese female with history of hypertension, hyperlipidemia, type 2 diabetes mellitus, restless leg syndrome, anxiety depression, attention deficit disorder. Husband lost his job with [employer] several months ago and remains unemployed. She was able to get insurance through the affordable healthcare act with [facility] but needs all of her medications refilled. She wants to get some of these from mail order and also to get some a local pharmacy. I explained to her that all of this is very complex and complicated in that I would prefer that we either deal with mail order prescriptions were was a local pharmacy not both for chronic medications. She wants to get off of Lexapro. She has cut down to 55 mg daily wants to know how to taper it. She may take one half of a 10 mg tablet every other day for 2 weeks and then discontinue it. I am concerned she really needs to be on that because of the stress in her life. She does take generic Tranxene for anxiety. She is currently taking one 7.5 mg tablet daily generally by splitting it in half and taking it twice daily. She is also on Adderall and had been placed on this by psychiatrist a number of years ago. She's currently taking Adderall 3 times daily but says it's cheaper if she gets a 40 mg tablet and takes it twice a day instead of 20 mg 3 times a day. I am a bit uncomfortable having to alter her prescriptions because of experience. Am also uncomfortable with the amount of medication she is on and has been on for some time. I've asked that she tried to stop Mirapex but she says she cannot. Says she's had restless legs for many years.    Review of Systems     Objective:   Physical Exam chest clear. Cardiac exam regular rate and rhythm. Extremities without edema        Assessment & Plan:  Homero Fellers talk with patient today  about complexity of prescription requests. This is become very confusing.  Tenormin was refilled 50 mg #90 with when necessary one year refills. Mirapex was refilled #90 with one refill. Tranxene was refilled #90 with one refill. Lipitor 40 mg daily was refilled for 6 months. She will taper off of Lexapro discontinue it the next 2 weeks. Patient given new prescription for Adderall 30 mg (not XR) #90 with no refill 1 by mouth twice daily. Patient does not feel she can do without Mirapex or Tranxene. Feels that she needs Adderall to keep herself organized. She may benefit from psychiatric consultation. Her previous psychiatrist retired and I took over her psychiatric prescriptions but it may be of benefit to having fresh look with a new psychiatrist. She is due for six-month recheck next month regarding hypertension hyperlipidemia type 2 diabetes and obesity.  Hypertension  Hyperlipidemia  Type 2 diabetes mellitus  Anxiety depression  Attention deficit disorder  Restless leg syndrome   Obesity  Situational stress

## 2012-05-11 NOTE — Patient Instructions (Addendum)
You may taper off of Lexapro over the next 2 weeks as directed. Continue with Tranxene sparingly. Continue Mirapex. Adderall has been changed from 20 mg 3 times daily to 30 mg twice daily. Tenormin 50 mg has been refill for one year. Lipitor has been refilled for 6 months.

## 2012-05-17 NOTE — Progress Notes (Signed)
Subjective:    Patient ID: Crystal Blake, female    DOB: 1955-07-07, 57 y.o.   MRN: [id number]  HPI 57 year old white female with history of asthma and allergic rhinitis, hyperlipidemia, restless leg syndrome, hypertension for health maintenance and evaluation of medical problems. Used to be treated by Dr. Jodi Marble  for attention deficit disorder. He had her on Adderall 20 mg 3 times daily. Subsequently after his retirement I assumed treatment for attention deficit. She is intolerant of Levaquin otherwise has no known drug allergies. Had cyst removed from her arm in 1972, rhinoplasty 1978 and a vaginal hysterectomy in February 2002. Tetanus immunization November 2002. Takes Singulair for asthma and Tenormin for hypertension. Takes Tranxene for anxiety. Has had triglycerides as high as 365 in 2012. LDL cholesterol at that time was 140. Total cholesterol was 262. Is on Lipitor for hyperlipidemia. In 2009 saw Dr. Barnet Pall for abnormal liver function tests and hepatomegaly with fatty infiltration. He checked her for chronic liver disease all of which was negative advised her to discontinue all alcohol intake and lose weight. In 2010 she saw Dr. Teressa Senter for benign cystic type lesion  which was excised from right index PIP joint. Had negative Cardiolite study in 2003 at [facility].  Has been taking Lexapro for depression. I think this should be continued for now however she feels that she does not need it.  There continues to be some stress at home. Husband works at [employer]. He recently received a DUI. One daughter remains at home and she has history of psychiatric illness- possible bipolar disorder and is treated by Dr. Toni Arthurs. Oldest daughter moved out into her own apartment and is attending [employer] and working part-time.  Social history: Married with 2 children. Social alcohol consumption. Nonsmoker. Graduate of [employer]. Currently does not work outside the home. Previously worked Technical sales engineer for computers. Has had issues trying to get up and going in the morning. Says she needs Adderall for this. Has issues focusing. Says she's had long-standing history of restless leg syndrome throughout her life. Financial stress.  Family history: Father died at age 59 from liver cancer. Mother with history of ovarian cancer and aortic valve replacement.    Review of Systems  Constitutional: Positive for fatigue.  HENT: Negative.   Eyes: Negative.   Respiratory: Negative.   Cardiovascular: Negative.   Gastrointestinal: Negative.   Endocrine: Negative.   Genitourinary: Negative.   Musculoskeletal: Positive for arthralgias.  Allergic/Immunologic:       History of asthma  Neurological:       History of restless leg syndrome  Hematological: Negative.   Psychiatric/Behavioral:       History of anxiety and attention deficit       Objective:   Physical Exam  Constitutional: She is oriented to person, place, and time. She appears well-developed and well-nourished. No distress.  HENT:  Head: Normocephalic and atraumatic.  Right Ear: External ear normal.  Left Ear: External ear normal.  Mouth/Throat: No oropharyngeal exudate.  Eyes: EOM are normal. Pupils are equal, round, and reactive to light. Right eye exhibits no discharge. Left eye exhibits no discharge. No scleral icterus.  Neck: Neck supple. No JVD present. No thyromegaly present.  Cardiovascular: Normal rate, normal heart sounds and intact distal pulses.   No murmur heard. Pulmonary/Chest: Effort normal and breath sounds normal. She has no wheezes. She has no rales.  Breasts normal female  Abdominal: Soft. Bowel sounds are normal. She exhibits no distension and  no mass. There is no tenderness. There is no rebound.  Genitourinary:  Bimanual exam is normal.  Musculoskeletal: She exhibits no edema.  Lymphadenopathy:    She has no cervical adenopathy.  Neurological: She is alert and oriented to person, place, and time.  She has normal reflexes. Coordination normal.  Skin: Skin is warm and dry. No rash noted. She is not diaphoretic.  Psychiatric: She has a normal mood and affect. Her behavior is normal. Judgment and thought content normal.  Slightly anxious          Assessment & Plan:  History of anxiety depression  History of attention deficit disorder  Hyperlipidemia  Hypertension  History of fatty liver infiltration  History of restless leg syndrome  History of asthma  History of allergic rhinitis  History of vaginal hysterectomy for menorrhagia 2002  Plan: Continue same medications and return in 6 months. She has some mail order prescriptions that need to be refilled particularly Adderall.

## 2012-05-17 NOTE — Patient Instructions (Addendum)
Continue same medications and return in 6 months. Try to diet and exercise.

## 2012-05-24 ENCOUNTER — Emergency Department (HOSPITAL_COMMUNITY): Payer: BC Managed Care – PPO

## 2012-05-24 ENCOUNTER — Emergency Department (HOSPITAL_COMMUNITY)
Admission: EM | Admit: 2012-05-24 | Discharge: 2012-05-24 | Disposition: A | Discharge status: Home / Self Care | Payer: BC Managed Care – PPO | Attending: Emergency Medicine | Admitting: Emergency Medicine

## 2012-05-24 ENCOUNTER — Encounter (HOSPITAL_COMMUNITY): Payer: Self-pay | Admitting: Emergency Medicine

## 2012-05-24 DIAGNOSIS — F329 Major depressive disorder, single episode, unspecified: Secondary | ICD-10-CM | POA: Insufficient documentation

## 2012-05-24 DIAGNOSIS — Z79899 Other long term (current) drug therapy: Secondary | ICD-10-CM | POA: Insufficient documentation

## 2012-05-24 DIAGNOSIS — Z23 Encounter for immunization: Secondary | ICD-10-CM | POA: Insufficient documentation

## 2012-05-24 DIAGNOSIS — S5012XA Contusion of left forearm, initial encounter: Secondary | ICD-10-CM

## 2012-05-24 DIAGNOSIS — S20212A Contusion of left front wall of thorax, initial encounter: Secondary | ICD-10-CM

## 2012-05-24 DIAGNOSIS — G2581 Restless legs syndrome: Secondary | ICD-10-CM | POA: Insufficient documentation

## 2012-05-24 DIAGNOSIS — Z87891 Personal history of nicotine dependence: Secondary | ICD-10-CM | POA: Insufficient documentation

## 2012-05-24 DIAGNOSIS — Y9289 Other specified places as the place of occurrence of the external cause: Secondary | ICD-10-CM | POA: Insufficient documentation

## 2012-05-24 DIAGNOSIS — Z8639 Personal history of other endocrine, nutritional and metabolic disease: Secondary | ICD-10-CM | POA: Insufficient documentation

## 2012-05-24 DIAGNOSIS — W01119A Fall on same level from slipping, tripping and stumbling with subsequent striking against unspecified sharp object, initial encounter: Secondary | ICD-10-CM

## 2012-05-24 DIAGNOSIS — F411 Generalized anxiety disorder: Secondary | ICD-10-CM | POA: Insufficient documentation

## 2012-05-24 DIAGNOSIS — F3289 Other specified depressive episodes: Secondary | ICD-10-CM | POA: Insufficient documentation

## 2012-05-24 DIAGNOSIS — S5010XA Contusion of unspecified forearm, initial encounter: Secondary | ICD-10-CM | POA: Insufficient documentation

## 2012-05-24 DIAGNOSIS — Z862 Personal history of diseases of the blood and blood-forming organs and certain disorders involving the immune mechanism: Secondary | ICD-10-CM | POA: Insufficient documentation

## 2012-05-24 DIAGNOSIS — Y9389 Activity, other specified: Secondary | ICD-10-CM | POA: Insufficient documentation

## 2012-05-24 DIAGNOSIS — W268XXA Contact with other sharp object(s), not elsewhere classified, initial encounter: Secondary | ICD-10-CM | POA: Insufficient documentation

## 2012-05-24 DIAGNOSIS — E785 Hyperlipidemia, unspecified: Secondary | ICD-10-CM | POA: Insufficient documentation

## 2012-05-24 DIAGNOSIS — S20219A Contusion of unspecified front wall of thorax, initial encounter: Secondary | ICD-10-CM | POA: Insufficient documentation

## 2012-05-24 MED ORDER — IBUPROFEN 800 MG PO TABS
800.0000 mg | ORAL_TABLET | Freq: Once | ORAL | Status: AC
  Administered 2012-05-24: 800 mg via ORAL
  Filled 2012-05-24: qty 1

## 2012-05-24 MED ORDER — TETANUS-DIPHTH-ACELL PERTUSSIS 5-2.5-18.5 LF-MCG/0.5 IM SUSP
0.5000 mL | Freq: Once | INTRAMUSCULAR | Status: AC
  Administered 2012-05-24: 0.5 mL via INTRAMUSCULAR
  Filled 2012-05-24: qty 0.5

## 2012-05-24 MED ORDER — OXYCODONE-ACETAMINOPHEN 5-325 MG PO TABS
1.0000 | ORAL_TABLET | Freq: Once | ORAL | Status: AC
  Administered 2012-05-24: 1 via ORAL
  Filled 2012-05-24: qty 1

## 2012-05-24 MED ORDER — IBUPROFEN 800 MG PO TABS: 800.0000 mg | ORAL_TABLET | Freq: Three times a day (TID) | ORAL | Status: DC

## 2012-05-24 MED ORDER — OXYCODONE-ACETAMINOPHEN 5-325 MG PO TABS: 1.0000 | ORAL_TABLET | Freq: Three times a day (TID) | ORAL | Status: DC | PRN

## 2012-05-24 NOTE — ED Notes (Signed)
Pt states that a step ladder fell out from under her and she is now having L arm pain and rib pain. Hematoma on L forearm and abrasions. Hit bird feeder coming down.

## 2012-05-24 NOTE — ED Provider Notes (Signed)
History    This chart was scribed for non-physician practitioner working with Suzi Roots, MD by Melba Coon, ED Scribe. This patient was seen in room WTR5/WTR5 and the patient's care was started at 7:16PM.     CSN: [id number]  Arrival date & time 05/24/12  1838   None     Chief Complaint  Patient presents with  . Fall  . Arm Pain  . Rib Injury    (Consider location/radiation/quality/duration/timing/severity/associated sxs/prior treatment) The history is provided by the patient. No language interpreter was used.   Crystal Blake is a 57 y.o. female who presents to the Emergency Department complaining of constant, mild to moderate left back pain and left arm pain with swelling, lacerations, and abrasions with a sudden onset 0.5 hours ago pertaining to a fall about 3 feet without head contact or LOC. She reports a step ladder fell out from under her and that she hit a bird feeder during the fall. She reports that her back and arm does not hurt to touch. She reports that deep inhalation, walking, and changing position aggravates the pain. Denies HA, fever, neck pain, sore throat, rash, CP, SOB, abdominal pain, nausea, emesis, diarrhea, dysuria, or extremity weakness, numbness, or tingling. Tetanus shot is not up to date. No known allergies. No other pertinent medical symptoms.  Past Medical History  Diagnosis Date  . Allergy   . Anemia   . Anxiety   . Depression   . Hyperlipidemia   . Thyroid disease     thyroid cyst  . Restless leg syndrome     Past Surgical History  Procedure Laterality Date  . Abdominal hysterectomy    . Rhinoplasty      No family history on file.  History  Substance Use Topics  . Smoking status: Former Smoker    Quit date: 09/22/1988  . Smokeless tobacco: Not on file  . Alcohol Use: Not on file    OB History   Grav Para Term Preterm Abortions TAB SAB Ect Mult Living                  Review of Systems 10 Systems reviewed and all are  negative for acute change except as noted in the HPI.   Allergies  Levofloxacin  Home Medications   Current Outpatient Rx  Name  Route  Sig  Dispense  Refill  . amphetamine-dextroamphetamine (ADDERALL) 30 MG tablet   Oral   Take 30 mg by mouth 2 (two) times daily.         Marland Kitchen atenolol (TENORMIN) 50 MG tablet   Oral   Take 50 mg by mouth daily.         Marland Kitchen atorvastatin (LIPITOR) 40 MG tablet   Oral   Take 40 mg by mouth every evening.         . clorazepate (TRANXENE) 3.75 MG tablet   Oral   Take 3.75 mg by mouth 2 (two) times daily as needed (anxiety).         Marland Kitchen escitalopram (LEXAPRO) 10 MG tablet   Oral   Take 5 mg by mouth daily as needed (weening off medication).          Marland Kitchen rOPINIRole (REQUIP) 1 MG tablet   Oral   Take 1 mg by mouth at bedtime.         Marland Kitchen ibuprofen (ADVIL,MOTRIN) 800 MG tablet   Oral   Take 1 tablet (800 mg total) by mouth 3 (three) times daily.  21 tablet   0   . oxyCODONE-acetaminophen (PERCOCET/ROXICET) 5-325 MG per tablet   Oral   Take 1 tablet by mouth every 8 (eight) hours as needed for pain.   15 tablet   0     BP 131/87  Pulse 81  Temp(Src) 97.9 F (36.6 C) (Oral)  SpO2 98%  Physical Exam  Nursing note and vitals reviewed. Constitutional: She is oriented to person, place, and time. She appears well-developed and well-nourished. No distress.  HENT:  Head: Normocephalic and atraumatic.  Eyes: EOM are normal.  Neck: Neck supple. No tracheal deviation present.  Cardiovascular: Normal rate, regular rhythm and normal heart sounds.   No murmur heard. Pulmonary/Chest: Effort normal and breath sounds normal. No respiratory distress. She has no wheezes. She has no rales.  Mild TTP without crepitus or ecchymosis of left posterior rib without flail chest.  Musculoskeletal: Normal range of motion. She exhibits tenderness.  TTP mild left forearm which has a large hematoma to the posterior aspect. There is also a 2 cm abrasion and  a small puncture wound. No active bleeding. No decreased ROM or numbness.   Neurological: She is alert and oriented to person, place, and time.  Strong grip strength to bilateral upper extremities.  Skin: Skin is warm and dry. No rash noted.  Psychiatric: She has a normal mood and affect. Her behavior is normal.    ED Course  Procedures (including critical care time)  DIAGNOSTIC STUDIES: Oxygen Saturation is 98% on room air, normal by my interpretation.    COORDINATION OF CARE:  7:21PM - left forearm XR and left rib/chest unilateral XR will be ordered for Crystal Blake.    Labs Reviewed - No data to display Dg Ribs Unilateral W/chest Left  05/24/2012  *RADIOLOGY REPORT*  Clinical Data: Abrasions, arm pain, fall from ladder  LEFT RIBS AND CHEST - 3+ VIEW  Comparison: None.  Findings: Normal cardiac silhouette.  No evidence of pulmonary contusion or pneumothorax.  No evidence of rib fracture or clavicle fracture.  Dedicated views of the left ribs demonstrate no displaced fracture.  IMPRESSION: No evidence of fracture or pneumothorax.   Original Report Authenticated By: Genevive Bi, M.D.    Dg Forearm Left  05/24/2012  *RADIOLOGY REPORT*  Clinical Data: Fall from ladder  LEFT FOREARM - 2 VIEW  Comparison: None.  Findings: No evidence of fracture or dislocation left radius or ulna.  No evidence of dislocation of the radiocarpal joint.  IMPRESSION: No fracture or dislocation.   Original Report Authenticated By: Genevive Bi, M.D.    8:33PM - recheck; imaging results reviewed and are unremarkable. Tetanus shot will be updated. Patient education given for compartment syndrome and discussed concerning symptoms that require follow up at the ED. Discussed expectations that her type of injuries may take weeks to months to completely heal. Ibuprofen and percocet will be prescribed. Orthopedic referral will be given and she is advised to follow up with ortho 1-2 days after ED visit today. Nurse  provided wound care for left forearm and ACE wrap applied to the wound to minimalize swelling. She is ready for d/c.  1. Fall against sharp object, initial encounter   2. Forearm contusion, left, initial encounter   3. Rib contusion, left, initial encounter       MDM  I personally performed the services described in this documentation, which was scribed in my presence. The recorded information has been reviewed and is accurate.        Tiffany  Irine Seal, PA 05/24/12 2159

## 2012-05-26 NOTE — ED Provider Notes (Signed)
Medical screening examination/treatment/procedure(s) were performed by non-physician practitioner and as supervising physician I was immediately available for consultation/collaboration.   Kevin E Steinl, MD 05/26/12 0725 

## 2012-06-02 ENCOUNTER — Other Ambulatory Visit: Payer: 59 | Admitting: Internal Medicine

## 2012-06-04 ENCOUNTER — Ambulatory Visit: Payer: 59 | Admitting: Internal Medicine

## 2012-07-14 ENCOUNTER — Other Ambulatory Visit: Payer: Self-pay | Admitting: Internal Medicine

## 2012-07-16 ENCOUNTER — Ambulatory Visit: Payer: Self-pay | Admitting: Internal Medicine

## 2012-08-20 ENCOUNTER — Other Ambulatory Visit: Payer: Self-pay | Admitting: Internal Medicine

## 2012-08-20 ENCOUNTER — Other Ambulatory Visit: Payer: BC Managed Care – PPO | Admitting: Internal Medicine

## 2012-08-20 DIAGNOSIS — E785 Hyperlipidemia, unspecified: Secondary | ICD-10-CM

## 2012-08-20 DIAGNOSIS — Z79899 Other long term (current) drug therapy: Secondary | ICD-10-CM

## 2012-08-20 DIAGNOSIS — R7301 Impaired fasting glucose: Secondary | ICD-10-CM

## 2012-08-20 LAB — LIPID PANEL: Cholesterol: 193 mg/dL (ref 0–200)

## 2012-08-20 LAB — HEPATIC FUNCTION PANEL
ALT: 41 U/L — ABNORMAL HIGH (ref 0–35)
AST: 28 U/L (ref 0–37)
Alkaline Phosphatase: 80 U/L (ref 39–117)
Bilirubin, Direct: 0.1 mg/dL (ref 0.0–0.3)
Indirect Bilirubin: 0.6 mg/dL (ref 0.0–0.9)
Total Bilirubin: 0.7 mg/dL (ref 0.3–1.2)

## 2012-08-20 LAB — HEMOGLOBIN A1C: Hgb A1c MFr Bld: 5.4 % (ref <5.7)

## 2012-08-21 ENCOUNTER — Ambulatory Visit (INDEPENDENT_AMBULATORY_CARE_PROVIDER_SITE_OTHER): Payer: BC Managed Care – PPO | Admitting: Internal Medicine

## 2012-08-21 ENCOUNTER — Encounter: Payer: Self-pay | Admitting: Internal Medicine

## 2012-08-21 ENCOUNTER — Ambulatory Visit: Payer: Self-pay | Admitting: Internal Medicine

## 2012-08-21 VITALS — BP 140/92 | HR 96 | Wt 176.0 lb

## 2012-08-21 DIAGNOSIS — F341 Dysthymic disorder: Secondary | ICD-10-CM

## 2012-08-21 DIAGNOSIS — E119 Type 2 diabetes mellitus without complications: Secondary | ICD-10-CM

## 2012-08-21 DIAGNOSIS — I10 Essential (primary) hypertension: Secondary | ICD-10-CM

## 2012-08-21 DIAGNOSIS — Z90710 Acquired absence of both cervix and uterus: Secondary | ICD-10-CM

## 2012-08-21 DIAGNOSIS — F419 Anxiety disorder, unspecified: Secondary | ICD-10-CM

## 2012-08-21 DIAGNOSIS — E785 Hyperlipidemia, unspecified: Secondary | ICD-10-CM

## 2012-08-21 DIAGNOSIS — G2581 Restless legs syndrome: Secondary | ICD-10-CM

## 2012-08-21 DIAGNOSIS — Z8709 Personal history of other diseases of the respiratory system: Secondary | ICD-10-CM

## 2012-08-21 DIAGNOSIS — F988 Other specified behavioral and emotional disorders with onset usually occurring in childhood and adolescence: Secondary | ICD-10-CM

## 2012-08-21 DIAGNOSIS — E669 Obesity, unspecified: Secondary | ICD-10-CM

## 2012-08-21 DIAGNOSIS — F32A Depression, unspecified: Secondary | ICD-10-CM

## 2012-08-21 NOTE — Progress Notes (Signed)
  Subjective:    Patient ID: Crystal Blake, female    DOB: [birth year], 57 y.o.   MRN: [id number]  HPI Since last visit, she is now seeing Dr. Milagros Evener for attention deficit medication, antidepressant and anxiety medication. 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 She says since her husband lost his job at [employer], he's been cooking healthy for her. She's been trying to exercise. Says she feels much better than she has in the number of years. Her daughter seem to be doing fairly well at the present time. Oldest daughter had a difficult time in her first year of college as she declared  her self as an art major [employer] having taken college courses in high school. Has now decided she does not want to major in art. Wants to be an [place] major. She may have to take a medical withdrawal for this most recent semester because she became depressed. However younger daughter has been accepted to middle college. She's also doing acting.  History of attention deficit disorder, anxiety depression, hypertension, hyperlipidemia, type 2 diabetes mellitus. History of asthma and allergic rhinitis. History of restless leg syndrome. History of fatty liver infiltration.    Review of Systems     Objective:   Physical Exam Neck is supple without JVD, thyromegaly, or carotid bruits. Chest: clear to auscultation. Cardiac exam: regular rate and rhythm normal S1 and S2. Extremities without edema. Diabetic foot exam: without ulcers or calluses. Pulses are normal in feet. She is overweight.        Assessment & Plan:  History of attention deficit disorder  History of anxiety depression  Type 2 diabetes mellitus controlled-stable without treatment  Hypertension-stable on Tenormin. Patient says blood pressure is elevated today because she took 2 Adderall tablets this morning by mistake.  Hyperlipidemia-treated with statin medication with results normal.  Obesity to  continue diet exercise and weight loss efforts  History of restless leg syndrome treated with Requip  History of asthma and allergic rhinitis-currently these are stable and not requiring treatment  Plan: Continue same medications and return in 6 months for physical examination. She is status post vaginal hysterectomy without oophorectomy.

## 2012-08-21 NOTE — Patient Instructions (Addendum)
Continue current medications and return in 6 months for physical examination. 

## 2013-01-20 ENCOUNTER — Other Ambulatory Visit: Payer: Self-pay | Admitting: Internal Medicine

## 2013-01-28 ENCOUNTER — Other Ambulatory Visit: Payer: Self-pay

## 2013-03-04 ENCOUNTER — Other Ambulatory Visit: Payer: BC Managed Care – PPO | Admitting: Internal Medicine

## 2013-03-05 ENCOUNTER — Encounter: Payer: BC Managed Care – PPO | Admitting: Internal Medicine

## 2013-07-02 ENCOUNTER — Encounter: Payer: Self-pay | Admitting: Internal Medicine

## 2013-07-02 ENCOUNTER — Ambulatory Visit (INDEPENDENT_AMBULATORY_CARE_PROVIDER_SITE_OTHER): Payer: BC Managed Care – PPO | Admitting: Internal Medicine

## 2013-07-02 VITALS — BP 150/86 | HR 84 | Wt 171.5 lb

## 2013-07-02 DIAGNOSIS — S335XXA Sprain of ligaments of lumbar spine, initial encounter: Secondary | ICD-10-CM

## 2013-07-02 DIAGNOSIS — S39012A Strain of muscle, fascia and tendon of lower back, initial encounter: Secondary | ICD-10-CM

## 2013-07-02 MED ORDER — METHYLPREDNISOLONE (PAK) 4 MG PO TABS: ORAL_TABLET | ORAL | Status: DC

## 2013-07-02 MED ORDER — HYDROCODONE-ACETAMINOPHEN 10-325 MG PO TABS: 1.0000 | ORAL_TABLET | Freq: Three times a day (TID) | ORAL | Status: DC | PRN

## 2013-07-02 NOTE — Patient Instructions (Signed)
Take Medrol in tapering course as directed. Continue light yoga stretches. Take hydrocodone/APAP as needed for pain

## 2013-07-02 NOTE — Progress Notes (Signed)
   Subjective:    Patient ID: Crystal Blake, female    DOB: Jan 15, 1956, 58 y.o.   MRN: [id number]  HPI Patient was driving an old jeep to the beach recently for 3-1/2 hours and apparently strained her back. Once at the beach, she proceeded with doing repairs about the beach house probably further straining her back. Has been doing some light yoga to stretch her back muscles but is having considerable pain. Difficulty getting out of bed in the morning because of pain.    Review of Systems     Objective:   Physical Exam Straight leg raising is negative at 90 bilaterally. Deep tendon reflexes 2+ and symmetrical in the knees. Muscle strength is 5 over 5 in the lower extremities. Has some difficulty with flexion and extension of her trunk due to pain. Pain over her right posterior superior iliac spine       Assessment & Plan:  Low back strain  Plan: Medrol 4 mg 6 day dosepak. Take in tapering course as directed. Hydrocodone 10/325 one by mouth twice daily as needed for back pain.

## 2013-07-09 ENCOUNTER — Telehealth: Payer: Self-pay | Admitting: Internal Medicine

## 2013-07-09 NOTE — Telephone Encounter (Signed)
Call in Medrol 4 mg 6 day Dosepack. Take in tapering course as directed. May need physical Therapy.

## 2013-07-12 ENCOUNTER — Other Ambulatory Visit: Payer: Self-pay

## 2013-07-12 MED ORDER — METHYLPREDNISOLONE (PAK) 4 MG PO TABS: ORAL_TABLET | ORAL | Status: DC

## 2013-07-24 ENCOUNTER — Other Ambulatory Visit: Payer: Self-pay | Admitting: Internal Medicine

## 2013-07-26 ENCOUNTER — Ambulatory Visit (INDEPENDENT_AMBULATORY_CARE_PROVIDER_SITE_OTHER): Payer: BC Managed Care – PPO | Admitting: Internal Medicine

## 2013-07-26 ENCOUNTER — Encounter: Payer: Self-pay | Admitting: Internal Medicine

## 2013-07-26 ENCOUNTER — Telehealth: Payer: Self-pay | Admitting: Internal Medicine

## 2013-07-26 DIAGNOSIS — M545 Low back pain, unspecified: Secondary | ICD-10-CM

## 2013-07-26 MED ORDER — METHYLPREDNISOLONE 4 MG PO TABS: ORAL_TABLET | ORAL | Status: DC

## 2013-07-26 MED ORDER — OXYCODONE-ACETAMINOPHEN 5-500 MG PO CAPS: 1.0000 | ORAL_CAPSULE | ORAL | Status: DC | PRN

## 2013-07-26 NOTE — Telephone Encounter (Signed)
Spoke with patient's husband; gave appointment for 2pm today.  Mr. Thaw verbalized understanding of instructions.

## 2013-07-26 NOTE — Patient Instructions (Signed)
Take 12 day Medrol course in tapering fashion as directed. Take Tylox sparingly for pain. Order given for physical therapy.

## 2013-07-26 NOTE — Telephone Encounter (Signed)
See at 2pm while Vaughan Basta is gettting next patient ready.

## 2013-07-26 NOTE — Progress Notes (Signed)
   Subjective:    Patient ID: Crystal Blake, female    DOB: 02-17-1956, 58 y.o.   MRN: [id number]  HPI Continues to have issues with low back pain. 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 She's been doing some sanding etc. Complaining of right low back pain and right lateral ankle pain. She's had 2 x six-day courses of steroid dosepak . Was taking hydrocodone/APAP for pain which she says did not help very much. She is agreeable to have physical therapy consultation.    Review of Systems     Objective:   Physical Exam Straight leg raising on the right is negative at 90. Muscle strength is normal in the right lower extremity.       Assessment & Plan:  Low back strain  ? Right radiculopathy with pain in left lateral ankle  Plan: The work she is doing is aggravating her back pain. She feels she cannot stop at this point in time. Going to try Medrol 4 mg 12 day taper. Gave her prescription for Tylox #40 by mouth up to twice daily as needed but uses sparingly. She does not want prescription for muscle relaxant because she thinks that aggravates restless leg syndrome. Prescription written for physical therapy consultation.

## 2013-08-02 ENCOUNTER — Telehealth: Payer: Self-pay | Admitting: Internal Medicine

## 2013-08-02 NOTE — Telephone Encounter (Signed)
Note given for PT for back pain. Refill Hydrocodone APAP 10/325 #60 with no refills. This needs to last until Appt at [facility].

## 2013-08-02 NOTE — Telephone Encounter (Signed)
Spoke with patient to advise that Dr. Renold Genta has written a Rx for Hydrocodone 10/325.  #60.  Take 1 po every 8 hours prn pain.  Dr. Renold Genta advised that patient should make these last until she is seen by the Ortho Dr.  Patient advised that she didn't take more than 1 a day before.  States that she made 2 weeks last for 1 month the last Rx.  Patient verbalizes understanding that these will need to last until she is seen by Dr. Donnetta Hutching @ [facility].  They can see her on 5/21 IF they receive a faxed order from our office.  I'll fax order and note to there office @ [phone].  Patient's husband will come and pick up Rx this evening before 5:00 p.m.

## 2013-08-07 ENCOUNTER — Other Ambulatory Visit: Payer: Self-pay | Admitting: Internal Medicine

## 2013-09-14 ENCOUNTER — Other Ambulatory Visit: Payer: BC Managed Care – PPO | Admitting: Internal Medicine

## 2013-09-16 ENCOUNTER — Encounter: Payer: BC Managed Care – PPO | Admitting: Internal Medicine

## 2013-10-19 ENCOUNTER — Other Ambulatory Visit: Payer: Self-pay

## 2013-10-19 ENCOUNTER — Telehealth: Payer: Self-pay | Admitting: Internal Medicine

## 2013-10-19 MED ORDER — HYDROCODONE-ACETAMINOPHEN 10-325 MG PO TABS: 1.0000 | ORAL_TABLET | Freq: Three times a day (TID) | ORAL | Status: DC | PRN

## 2013-10-19 NOTE — Telephone Encounter (Signed)
Patient notified that she can pick up Rx tomorrow 9:00 - 1:00 p.m.

## 2013-10-19 NOTE — Telephone Encounter (Signed)
Written Rx for Hydrocodone 10/325 #60 with no refill

## 2013-11-02 ENCOUNTER — Other Ambulatory Visit: Payer: Self-pay | Admitting: Internal Medicine

## 2013-11-02 ENCOUNTER — Other Ambulatory Visit: Payer: BC Managed Care – PPO | Admitting: Internal Medicine

## 2013-11-02 DIAGNOSIS — I10 Essential (primary) hypertension: Secondary | ICD-10-CM

## 2013-11-02 DIAGNOSIS — E785 Hyperlipidemia, unspecified: Secondary | ICD-10-CM

## 2013-11-02 DIAGNOSIS — Z0000 Encounter for general adult medical examination without abnormal findings: Secondary | ICD-10-CM

## 2013-11-02 DIAGNOSIS — Z79899 Other long term (current) drug therapy: Secondary | ICD-10-CM

## 2013-11-02 LAB — CBC WITH DIFFERENTIAL/PLATELET
Basophils Absolute: 0.1 10*3/uL (ref 0.0–0.1)
Basophils Relative: 1 % (ref 0–1)
EOS ABS: 0.3 10*3/uL (ref 0.0–0.7)
Eosinophils Relative: 3 % (ref 0–5)
HCT: 44.4 % (ref 36.0–46.0)
Hemoglobin: 15.1 g/dL — ABNORMAL HIGH (ref 12.0–15.0)
LYMPHS ABS: 3.6 10*3/uL (ref 0.7–4.0)
LYMPHS PCT: 41 % (ref 12–46)
MCH: 28.8 pg (ref 26.0–34.0)
MCHC: 34 g/dL (ref 30.0–36.0)
MCV: 84.6 fL (ref 78.0–100.0)
MONO ABS: 0.6 10*3/uL (ref 0.1–1.0)
MONOS PCT: 7 % (ref 3–12)
NEUTROS ABS: 4.2 10*3/uL (ref 1.7–7.7)
Neutrophils Relative %: 48 % (ref 43–77)
PLATELETS: 363 10*3/uL (ref 150–400)
RBC: 5.25 MIL/uL — ABNORMAL HIGH (ref 3.87–5.11)
RDW: 13.6 % (ref 11.5–15.5)
WBC: 8.7 10*3/uL (ref 4.0–10.5)

## 2013-11-03 LAB — COMPREHENSIVE METABOLIC PANEL
ALBUMIN: 4.7 g/dL (ref 3.5–5.2)
ALT: 42 U/L — ABNORMAL HIGH (ref 0–35)
AST: 32 U/L (ref 0–37)
Alkaline Phosphatase: 65 U/L (ref 39–117)
BILIRUBIN TOTAL: 0.5 mg/dL (ref 0.2–1.2)
BUN: 16 mg/dL (ref 6–23)
CO2: 28 meq/L (ref 19–32)
Calcium: 10 mg/dL (ref 8.4–10.5)
Chloride: 101 mEq/L (ref 96–112)
Creat: 0.68 mg/dL (ref 0.50–1.10)
GLUCOSE: 94 mg/dL (ref 70–99)
Potassium: 4.3 mEq/L (ref 3.5–5.3)
SODIUM: 137 meq/L (ref 135–145)
TOTAL PROTEIN: 7.1 g/dL (ref 6.0–8.3)

## 2013-11-03 LAB — LIPID PANEL
CHOLESTEROL: 229 mg/dL — AB (ref 0–200)
HDL: 54 mg/dL (ref >39)
LDL Cholesterol: 108 mg/dL — ABNORMAL HIGH (ref 0–99)
TRIGLYCERIDES: 334 mg/dL — AB (ref <150)
Total CHOL/HDL Ratio: 4.2 Ratio
VLDL: 67 mg/dL — AB (ref 0–40)

## 2013-11-03 LAB — VITAMIN D 25 HYDROXY (VIT D DEFICIENCY, FRACTURES): Vit D, 25-Hydroxy: 31 ng/mL (ref 30–89)

## 2013-11-03 LAB — TSH: TSH: 3.402 u[IU]/mL (ref 0.350–4.500)

## 2013-11-04 ENCOUNTER — Encounter: Payer: Self-pay | Admitting: Internal Medicine

## 2013-11-04 ENCOUNTER — Ambulatory Visit (INDEPENDENT_AMBULATORY_CARE_PROVIDER_SITE_OTHER): Payer: BC Managed Care – PPO | Admitting: Internal Medicine

## 2013-11-04 VITALS — BP 140/100 | HR 88 | Temp 98.3°F | Ht 67.0 in | Wt 168.0 lb

## 2013-11-04 DIAGNOSIS — M545 Low back pain, unspecified: Secondary | ICD-10-CM

## 2013-11-04 DIAGNOSIS — F341 Dysthymic disorder: Secondary | ICD-10-CM

## 2013-11-04 DIAGNOSIS — F988 Other specified behavioral and emotional disorders with onset usually occurring in childhood and adolescence: Secondary | ICD-10-CM

## 2013-11-04 DIAGNOSIS — F32A Depression, unspecified: Secondary | ICD-10-CM

## 2013-11-04 DIAGNOSIS — E669 Obesity, unspecified: Secondary | ICD-10-CM

## 2013-11-04 DIAGNOSIS — G2581 Restless legs syndrome: Secondary | ICD-10-CM

## 2013-11-04 DIAGNOSIS — Z0000 Encounter for general adult medical examination without abnormal findings: Secondary | ICD-10-CM

## 2013-11-04 DIAGNOSIS — I10 Essential (primary) hypertension: Secondary | ICD-10-CM

## 2013-11-04 DIAGNOSIS — F329 Major depressive disorder, single episode, unspecified: Secondary | ICD-10-CM

## 2013-11-04 DIAGNOSIS — F419 Anxiety disorder, unspecified: Secondary | ICD-10-CM

## 2013-11-04 DIAGNOSIS — E785 Hyperlipidemia, unspecified: Secondary | ICD-10-CM

## 2013-11-04 LAB — POCT URINALYSIS DIPSTICK
BILIRUBIN UA: NEGATIVE
Blood, UA: NEGATIVE
Glucose, UA: NEGATIVE
KETONES UA: NEGATIVE
LEUKOCYTES UA: NEGATIVE
Nitrite, UA: NEGATIVE
Protein, UA: NEGATIVE
Spec Grav, UA: 1.025
Urobilinogen, UA: NEGATIVE
pH, UA: 5

## 2013-11-04 LAB — HEMOGLOBIN A1C
HEMOGLOBIN A1C: 5.7 % — AB (ref <5.7)
MEAN PLASMA GLUCOSE: 117 mg/dL — AB (ref <117)

## 2013-11-04 MED ORDER — IBUPROFEN 800 MG PO TABS: 800.0000 mg | ORAL_TABLET | Freq: Three times a day (TID) | ORAL | Status: DC | PRN

## 2013-11-04 NOTE — Patient Instructions (Addendum)
Work on exercise now back is better. Return in 3 months for office visit, lipid panel liver functions and hemoglobin A1c. Add  hemoglobin A1c to today's labs. Order placed for mammogram.

## 2013-12-19 NOTE — Progress Notes (Signed)
Subjective:    Patient ID: Crystal Blake, female    DOB: 1956-01-21, 58 y.o.   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 Has been trying to do a lot of the work herself in a strained her back. She's been going to physical therapy. She has a history of hypertension, hyperlipidemia, obesity, attention deficit issues. History of anxiety depression.  Has been a patient in this practice since 1987. She had a suicidal gesture at 58 years old. Former smoker quit in the late 1980s. History of migraine headaches. History of allergic rhinitis. Had benign cyst removed from left upper arm in 1972. Abortion in 1979. Severe depression 1980.  Social history: She is married. Has 2 daughters. She has a college degree from [employer] in [place]. 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  Family history: Father died with liver cancer. Mother deceased as well with history of hypertension hyperlipidemia and alcoholism.  Blood pressure is elevated today. Patient not sure if she took Tenormin or not. Promises to watch her blood pressure at home.  Tetanus immunization March 2014.    Review of Systems  Constitutional: Positive for fatigue.  Respiratory: Negative.   Cardiovascular: Negative.   Gastrointestinal: Negative.   Musculoskeletal:       Back pain  Psychiatric/Behavioral:       Anxiety depression stable       Objective:   Physical Exam  Vitals reviewed. Constitutional: She is oriented to person, place, and time. She appears well-developed and well-nourished. No distress.  HENT:  Head: Normocephalic and atraumatic.  Right Ear: External ear normal.  Left Ear: External ear normal.  Nose: Nose normal.  Mouth/Throat: Oropharynx is clear and moist. No oropharyngeal exudate.  Eyes:  Conjunctivae and EOM are normal. Pupils are equal, round, and reactive to light. Right eye exhibits no discharge. Left eye exhibits no discharge. No scleral icterus.  Neck: Neck supple. No JVD present. No thyromegaly present.  Cardiovascular: Normal rate, regular rhythm, normal heart sounds and intact distal pulses.   No murmur heard. Pulmonary/Chest: Effort normal. No respiratory distress. She has no wheezes. She has no rales. She exhibits no tenderness.  Breasts normal female  Abdominal: Soft. Bowel sounds are normal. She exhibits no distension and no mass. There is no tenderness. There is no rebound and no guarding.  Genitourinary:  Status post hysterectomy  Musculoskeletal: Normal range of motion. She exhibits no edema.  Lymphadenopathy:    She has no cervical adenopathy.  Neurological: She is alert and oriented to person, place, and time. She has normal reflexes. She displays normal reflexes. No cranial nerve deficit. Coordination normal.  Skin: Skin is warm and dry. No rash noted. She is not diaphoretic.  Psychiatric: She has a normal mood and affect. Her behavior is normal. Judgment and thought content normal.          Assessment & Plan:  Hypertension-patient is to watch blood pressure at home. Is elevated in the office today. Return in 3 months.  Back pain-prescription for ibuprofen 800 mg 3 times daily. Begin exercise program after physical therapy completed.  Hyperlipidemia-on statin therapy however does not appear to have been taking medication recently. See results.  Anxiety depression  History of attention deficit disorder  History of restless  leg syndrome treated with Requip. Patient says she cannot do without this medication.  Plan: Annual mammogram. Reminded patient regarding colonoscopy. Return in 3 months.

## 2013-12-28 ENCOUNTER — Telehealth: Payer: Self-pay | Admitting: Internal Medicine

## 2013-12-28 NOTE — Telephone Encounter (Signed)
Patient walked in to request pain medication.  She is taking a trip to [place] this weekend (leaving Friday) with [place] to look at [employer].  She states she has a little Vicodin left; however, the "other" medication that you prescribed before works best.  She thinks it was Oxycodone/Percocet.  I don't see it listed on the medications list.  Advised we would research, speak with Dr. Renold Genta and give her a call on Wednesday.  Please advise.

## 2013-12-29 MED ORDER — OXYCODONE-ACETAMINOPHEN 5-325 MG PO TABS: 1.0000 | ORAL_TABLET | Freq: Two times a day (BID) | ORAL | Status: DC | PRN

## 2013-12-29 NOTE — Telephone Encounter (Signed)
Prescribe Oxycodone 5/325 #60 with NO refill one po bid prn back pain

## 2013-12-29 NOTE — Telephone Encounter (Signed)
Oxycodone rx printed.  Left message informing patient.

## 2014-01-09 ENCOUNTER — Other Ambulatory Visit: Payer: Self-pay | Admitting: Internal Medicine

## 2014-02-01 ENCOUNTER — Other Ambulatory Visit: Payer: Self-pay

## 2014-02-01 ENCOUNTER — Other Ambulatory Visit: Payer: BC Managed Care – PPO | Admitting: Internal Medicine

## 2014-02-01 ENCOUNTER — Telehealth: Payer: Self-pay | Admitting: Internal Medicine

## 2014-02-01 DIAGNOSIS — E119 Type 2 diabetes mellitus without complications: Secondary | ICD-10-CM

## 2014-02-01 DIAGNOSIS — E785 Hyperlipidemia, unspecified: Secondary | ICD-10-CM

## 2014-02-01 DIAGNOSIS — I10 Essential (primary) hypertension: Secondary | ICD-10-CM

## 2014-02-01 LAB — LIPID PANEL
CHOLESTEROL: 145 mg/dL (ref 0–200)
HDL: 47 mg/dL (ref >39)
LDL Cholesterol: 61 mg/dL (ref 0–99)
TRIGLYCERIDES: 183 mg/dL — AB (ref <150)
Total CHOL/HDL Ratio: 3.1 Ratio
VLDL: 37 mg/dL (ref 0–40)

## 2014-02-01 LAB — HEPATIC FUNCTION PANEL
ALK PHOS: 54 U/L (ref 39–117)
ALT: 29 U/L (ref 0–35)
AST: 24 U/L (ref 0–37)
Albumin: 4.5 g/dL (ref 3.5–5.2)
BILIRUBIN INDIRECT: 0.6 mg/dL (ref 0.2–1.2)
Bilirubin, Direct: 0.1 mg/dL (ref 0.0–0.3)
TOTAL PROTEIN: 7 g/dL (ref 6.0–8.3)
Total Bilirubin: 0.7 mg/dL (ref 0.2–1.2)

## 2014-02-01 MED ORDER — PREDNISONE 10 MG PO TABS: 10.0000 mg | ORAL_TABLET | ORAL | Status: DC

## 2014-02-01 NOTE — Telephone Encounter (Signed)
Prednisone sent to pharmacy.  Patient aware. 

## 2014-02-01 NOTE — Telephone Encounter (Signed)
Would like to know if you would consider giving her another round of Prednisone to try for her back.  She has pain meds left.  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  She see's you next week.  Wants to try the Prednisone prior to seeing you next week to see if it will help so that you will know what to know by next week if it hasn't helped her back by next Thursday when she has her appointment to see you.  Please advise.   Pharmacy:  [facility] [address]

## 2014-02-01 NOTE — Telephone Encounter (Signed)
Please call in Prednisone 10 mg 6 day dose pack

## 2014-02-02 LAB — HEMOGLOBIN A1C
Hgb A1c MFr Bld: 5.7 % — ABNORMAL HIGH (ref <5.7)
Mean Plasma Glucose: 117 mg/dL — ABNORMAL HIGH (ref <117)

## 2014-02-08 ENCOUNTER — Other Ambulatory Visit: Payer: BC Managed Care – PPO | Admitting: Internal Medicine

## 2014-02-10 ENCOUNTER — Ambulatory Visit (INDEPENDENT_AMBULATORY_CARE_PROVIDER_SITE_OTHER): Payer: BC Managed Care – PPO | Admitting: Internal Medicine

## 2014-02-10 ENCOUNTER — Encounter: Payer: Self-pay | Admitting: Internal Medicine

## 2014-02-10 VITALS — BP 110/80 | HR 94 | Temp 98.7°F | Ht 67.0 in | Wt 168.0 lb

## 2014-02-10 DIAGNOSIS — I10 Essential (primary) hypertension: Secondary | ICD-10-CM

## 2014-02-10 DIAGNOSIS — E119 Type 2 diabetes mellitus without complications: Secondary | ICD-10-CM

## 2014-02-10 DIAGNOSIS — E785 Hyperlipidemia, unspecified: Secondary | ICD-10-CM

## 2014-02-10 DIAGNOSIS — F419 Anxiety disorder, unspecified: Secondary | ICD-10-CM

## 2014-02-10 DIAGNOSIS — F418 Other specified anxiety disorders: Secondary | ICD-10-CM

## 2014-02-10 DIAGNOSIS — F329 Major depressive disorder, single episode, unspecified: Secondary | ICD-10-CM

## 2014-02-10 DIAGNOSIS — F32A Depression, unspecified: Secondary | ICD-10-CM

## 2014-02-10 DIAGNOSIS — M545 Low back pain, unspecified: Secondary | ICD-10-CM

## 2014-02-10 MED ORDER — ATENOLOL 50 MG PO TABS: 50.0000 mg | ORAL_TABLET | Freq: Every day | ORAL | Status: DC

## 2014-02-10 MED ORDER — ATORVASTATIN CALCIUM 40 MG PO TABS: 40.0000 mg | ORAL_TABLET | Freq: Every day | ORAL | Status: DC

## 2014-02-10 MED ORDER — HYDROCODONE-ACETAMINOPHEN 10-325 MG PO TABS: 1.0000 | ORAL_TABLET | Freq: Three times a day (TID) | ORAL | Status: DC | PRN

## 2014-02-10 MED ORDER — IBUPROFEN 800 MG PO TABS: 800.0000 mg | ORAL_TABLET | Freq: Three times a day (TID) | ORAL | Status: DC | PRN

## 2014-02-10 NOTE — Patient Instructions (Addendum)
Continue same meds and return in 6 months 

## 2014-05-21 NOTE — Progress Notes (Signed)
   Subjective:    Patient ID: Anjanae Aundra Dubin, female    DOB: 04-25-1955, 59 y.o.   MRN: [id number]  HPI 3 month follow-up visit on hypertension, hyperlipidemia, type 2 diabetes and back pain. Was able to sell beach house. Working on [employer] aggravated her back quite a bit. Less financial stress and selling beach house. Has been going to physical therapy for back pain.    Review of Systems     Objective:   Physical Exam   Neck supple without thyromegaly JVD or carotid bruits. Chest clear. Cardiac exam regular rate and rhythm. Extremity is without edema. Straight leg raising is negative at 90     Assessment & Plan:  Hypertension-stable with current treatment  Hyperlipidemia-triglycerides have improved from 334-183.  Type 2 diabetes mellitus-hemoglobin A1c is 5.7% and stable  Back pain-improved with less physical activity  Plan: Physical exam due after mid-August. Recommend diet exercise and weight loss.

## 2014-07-27 ENCOUNTER — Telehealth: Payer: Self-pay | Admitting: Internal Medicine

## 2014-07-27 ENCOUNTER — Other Ambulatory Visit: Payer: Self-pay | Admitting: *Deleted

## 2014-07-27 MED ORDER — HYDROCODONE-ACETAMINOPHEN 10-325 MG PO TABS: 1.0000 | ORAL_TABLET | Freq: Three times a day (TID) | ORAL | Status: DC | PRN

## 2014-07-27 NOTE — Telephone Encounter (Signed)
Script printed for Dr Renold Genta to sign

## 2014-07-27 NOTE — Telephone Encounter (Signed)
She's leaving for [place] tomorrow at 9:00 a.m.  She's having back trouble and was across the street having some dental work done.  She walked in and asked if there was any way to get a refill on her Norco 10-325 to take with her.  She realizes it's last minute and may not be possible.  She has 3 pain pills left and can stretch them out if she has to.  She has been taking a lot of Ibuprofen and states this stirs up her IBS.    Advised that we may/may not be able to get this ready for her on such short notice, but I would do my best and if we are able to get it ready, I'll call her on her cell phone.  Patient understands this may not be possible.    Would like a refill on her Rx Norco 10-325.  Last filled 01/2014.    Thanks.

## 2014-07-27 NOTE — Telephone Encounter (Signed)
Dr. Renold Genta advised to print off Rx.    Called patient to advised Rx is ready to pick up.

## 2014-09-19 ENCOUNTER — Other Ambulatory Visit: Payer: Self-pay

## 2014-11-08 ENCOUNTER — Other Ambulatory Visit: Payer: Self-pay | Admitting: Internal Medicine

## 2014-12-24 ENCOUNTER — Other Ambulatory Visit: Payer: Self-pay | Admitting: Internal Medicine

## 2014-12-25 NOTE — Telephone Encounter (Signed)
Refill x 3 months 

## 2015-03-13 ENCOUNTER — Other Ambulatory Visit: Payer: Self-pay | Admitting: Internal Medicine

## 2015-04-04 ENCOUNTER — Telehealth: Payer: Self-pay | Admitting: Internal Medicine

## 2015-04-04 ENCOUNTER — Other Ambulatory Visit: Payer: Self-pay | Admitting: Internal Medicine

## 2015-04-04 NOTE — Telephone Encounter (Signed)
Did not come today for CPE labs. Has canceled CPE appointment in the near future and has rescheduled March 2017 after contacting her today

## 2015-04-06 ENCOUNTER — Encounter: Payer: Self-pay | Admitting: Internal Medicine

## 2015-04-11 ENCOUNTER — Other Ambulatory Visit: Payer: Self-pay | Admitting: Internal Medicine

## 2015-06-13 ENCOUNTER — Other Ambulatory Visit: Payer: Self-pay | Admitting: Internal Medicine

## 2015-06-15 ENCOUNTER — Other Ambulatory Visit: Payer: Self-pay | Admitting: Internal Medicine

## 2015-06-15 ENCOUNTER — Other Ambulatory Visit: Payer: BLUE CROSS/BLUE SHIELD | Admitting: Internal Medicine

## 2015-06-15 DIAGNOSIS — E785 Hyperlipidemia, unspecified: Secondary | ICD-10-CM

## 2015-06-15 DIAGNOSIS — I10 Essential (primary) hypertension: Secondary | ICD-10-CM

## 2015-06-15 DIAGNOSIS — Z1321 Encounter for screening for nutritional disorder: Secondary | ICD-10-CM

## 2015-06-15 DIAGNOSIS — Z0000 Encounter for general adult medical examination without abnormal findings: Secondary | ICD-10-CM

## 2015-06-15 DIAGNOSIS — Z130 Encounter for screening for diseases of the blood and blood-forming organs and certain disorders involving the immune mechanism: Secondary | ICD-10-CM

## 2015-06-15 DIAGNOSIS — Z1329 Encounter for screening for other suspected endocrine disorder: Secondary | ICD-10-CM

## 2015-06-15 LAB — COMPLETE METABOLIC PANEL WITH GFR
ALBUMIN: 4.3 g/dL (ref 3.6–5.1)
ALK PHOS: 60 U/L (ref 33–130)
ALT: 30 U/L — ABNORMAL HIGH (ref 6–29)
AST: 22 U/L (ref 10–35)
BILIRUBIN TOTAL: 0.6 mg/dL (ref 0.2–1.2)
BUN: 12 mg/dL (ref 7–25)
CO2: 25 mmol/L (ref 20–31)
Calcium: 9.6 mg/dL (ref 8.6–10.4)
Chloride: 105 mmol/L (ref 98–110)
Creat: 0.74 mg/dL (ref 0.50–1.05)
GFR, Est African American: >89 mL/min (ref >=60)
GFR, Est Non African American: >89 mL/min (ref >=60)
GLUCOSE: 94 mg/dL (ref 65–99)
Potassium: 4.6 mmol/L (ref 3.5–5.3)
SODIUM: 140 mmol/L (ref 135–146)
TOTAL PROTEIN: 7.2 g/dL (ref 6.1–8.1)

## 2015-06-15 LAB — CBC WITH DIFFERENTIAL/PLATELET
Basophils Absolute: 0.1 10*3/uL (ref 0.0–0.1)
Basophils Relative: 1 % (ref 0–1)
Eosinophils Absolute: 0.2 10*3/uL (ref 0.0–0.7)
Eosinophils Relative: 3 % (ref 0–5)
HEMATOCRIT: 43.8 % (ref 36.0–46.0)
Hemoglobin: 14.5 g/dL (ref 12.0–15.0)
Lymphocytes Relative: 43 % (ref 12–46)
Lymphs Abs: 3.4 10*3/uL (ref 0.7–4.0)
MCH: 28.5 pg (ref 26.0–34.0)
MCHC: 33.1 g/dL (ref 30.0–36.0)
MCV: 86.1 fL (ref 78.0–100.0)
MONO ABS: 0.6 10*3/uL (ref 0.1–1.0)
MONOS PCT: 7 % (ref 3–12)
MPV: 9.3 fL (ref 8.6–12.4)
Neutro Abs: 3.7 10*3/uL (ref 1.7–7.7)
Neutrophils Relative %: 46 % (ref 43–77)
Platelets: 328 10*3/uL (ref 150–400)
RBC: 5.09 MIL/uL (ref 3.87–5.11)
RDW: 13.9 % (ref 11.5–15.5)
WBC: 8 10*3/uL (ref 4.0–10.5)

## 2015-06-15 LAB — LIPID PANEL
Cholesterol: 192 mg/dL (ref 125–200)
HDL: 52 mg/dL (ref >=46)
LDL Cholesterol: 85 mg/dL (ref <130)
Total CHOL/HDL Ratio: 3.7 Ratio (ref <=5.0)
Triglycerides: 276 mg/dL — ABNORMAL HIGH (ref <150)
VLDL: 55 mg/dL — ABNORMAL HIGH (ref <30)

## 2015-06-15 LAB — TSH: TSH: 3.32 m[IU]/L

## 2015-06-16 LAB — VITAMIN D 25 HYDROXY (VIT D DEFICIENCY, FRACTURES): VIT D 25 HYDROXY: 24 ng/mL — AB (ref 30–100)

## 2015-06-20 ENCOUNTER — Other Ambulatory Visit: Payer: Self-pay | Admitting: Internal Medicine

## 2015-06-20 ENCOUNTER — Ambulatory Visit (INDEPENDENT_AMBULATORY_CARE_PROVIDER_SITE_OTHER): Payer: BLUE CROSS/BLUE SHIELD | Admitting: Internal Medicine

## 2015-06-20 ENCOUNTER — Encounter: Payer: Self-pay | Admitting: Gastroenterology

## 2015-06-20 ENCOUNTER — Encounter: Payer: Self-pay | Admitting: Internal Medicine

## 2015-06-20 VITALS — BP 148/90 | HR 95 | Temp 97.7°F | Resp 20 | Ht 67.0 in | Wt 177.0 lb

## 2015-06-20 DIAGNOSIS — E669 Obesity, unspecified: Secondary | ICD-10-CM | POA: Diagnosis not present

## 2015-06-20 DIAGNOSIS — G8929 Other chronic pain: Secondary | ICD-10-CM

## 2015-06-20 DIAGNOSIS — F419 Anxiety disorder, unspecified: Secondary | ICD-10-CM

## 2015-06-20 DIAGNOSIS — I10 Essential (primary) hypertension: Secondary | ICD-10-CM | POA: Diagnosis not present

## 2015-06-20 DIAGNOSIS — E785 Hyperlipidemia, unspecified: Secondary | ICD-10-CM

## 2015-06-20 DIAGNOSIS — F32A Depression, unspecified: Secondary | ICD-10-CM

## 2015-06-20 DIAGNOSIS — E8881 Metabolic syndrome: Secondary | ICD-10-CM | POA: Diagnosis not present

## 2015-06-20 DIAGNOSIS — F329 Major depressive disorder, single episode, unspecified: Secondary | ICD-10-CM

## 2015-06-20 DIAGNOSIS — G2581 Restless legs syndrome: Secondary | ICD-10-CM | POA: Diagnosis not present

## 2015-06-20 DIAGNOSIS — M545 Low back pain, unspecified: Secondary | ICD-10-CM

## 2015-06-20 DIAGNOSIS — L988 Other specified disorders of the skin and subcutaneous tissue: Secondary | ICD-10-CM

## 2015-06-20 DIAGNOSIS — IMO0002 Reserved for concepts with insufficient information to code with codable children: Secondary | ICD-10-CM

## 2015-06-20 DIAGNOSIS — F418 Other specified anxiety disorders: Secondary | ICD-10-CM | POA: Diagnosis not present

## 2015-06-20 DIAGNOSIS — R7302 Impaired glucose tolerance (oral): Secondary | ICD-10-CM | POA: Diagnosis not present

## 2015-06-20 DIAGNOSIS — Z0000 Encounter for general adult medical examination without abnormal findings: Secondary | ICD-10-CM

## 2015-06-20 LAB — POCT URINALYSIS DIPSTICK
Bilirubin, UA: NEGATIVE
Blood, UA: NEGATIVE
GLUCOSE UA: NEGATIVE
Ketones, UA: NEGATIVE
LEUKOCYTES UA: NEGATIVE
Nitrite, UA: NEGATIVE
PROTEIN UA: NEGATIVE
Spec Grav, UA: 1.015
UROBILINOGEN UA: 0.2
pH, UA: 7.5

## 2015-06-20 MED ORDER — HYDROCHLOROTHIAZIDE 25 MG PO TABS: 25.0000 mg | ORAL_TABLET | Freq: Every day | ORAL | Status: DC

## 2015-06-20 NOTE — Patient Instructions (Addendum)
RTC in 3 weeks for OV and B-met and blood pressure check. Hemoglobin AIC pending. Please continue diet exercise and weight loss efforts.

## 2015-06-20 NOTE — Progress Notes (Signed)
Subjective:    Patient ID: Crystal Blake, female    DOB: 1955/07/17, 60 y.o.   MRN: [id number]  HPI 60 year old Female for health maintenance exam and evaluation of medical issues.History of essential hypertension, impaired glucose tolerance, hyperlipidemia, obesity, metabolic syndrome, chronic low back pain, restless leg syndrome, anxiety depression. Has gained 9 pounds since 2015. Says she's unable to exercise much due to chronic low back pain. Blood pressure is elevated at 148/90. She is on Tenormin and has taken that for many years for hypertension.  She has a history of attention deficit disorder. Has a hard time getting out of bed in the mornings due to fatigue.  Had tetanus immunization 2014. No recent mammogram on file.  She is been a patient in this practice since 1987.  Status post hysterectomy without oophorectomy  She had a suicidal just her at 60 years old. She is a former smoker and quit in the late 1980s. History of migraine headaches. History of allergic rhinitis. Had a benign cyst removed from her left upper arm in 1972. Abortion 1979. Severe depression 1980.  Social history: She is married. She has 2 daughters both of whom are now living in [place]. One daughter is attending [employer] and the other one is a Equities trader. She has a college degree from [employer] in [place]. 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  Family history: Father died with liver cancer. Mother deceased as well with history of hypertension, hyperlipidemia and alcoholism.  Blood pressure elevated today and will need follow-up. We are going to add HCTZ to Tenormin.    Review of Systems  Constitutional: Positive for fatigue.  Respiratory: Negative.   Cardiovascular: Negative.   Genitourinary: Negative.   Musculoskeletal:       Chronic recurrent low back pain  Neurological:       History of  restless leg syndrome  Psychiatric/Behavioral:       Anxiety depression and attention deficit disorder        Objective:   Physical Exam  Constitutional: She is oriented to person, place, and time. She appears well-developed and well-nourished. No distress.  HENT:  Head: Normocephalic and atraumatic.  Right Ear: External ear normal.  Left Ear: External ear normal.  Mouth/Throat: Oropharynx is clear and moist. No oropharyngeal exudate.  Eyes: Conjunctivae and EOM are normal. Pupils are equal, round, and reactive to light.  Neck: Neck supple. No JVD present. No thyromegaly present.  Cardiovascular: Normal rate, regular rhythm, normal heart sounds and intact distal pulses.   No murmur heard. Pulmonary/Chest: Effort normal and breath sounds normal. No respiratory distress. She has no wheezes. She has no rales.  Breasts normal female without masses  Abdominal: Soft. Bowel sounds are normal. She exhibits no distension and no mass. There is no tenderness. There is no rebound and no guarding.  Genitourinary:  Pap not done because of hysterectomy. Bimanual normal.  Musculoskeletal: She exhibits no edema.  She appears to have cystic lesion left lateral index finger PIP joint  Lymphadenopathy:    She has no cervical adenopathy.  Neurological: She is alert and oriented to person, place, and time. She displays normal reflexes. No cranial nerve deficit. Coordination normal.  Skin: Skin is warm and dry. No rash noted. She is not diaphoretic.  Psychiatric: She has a normal mood and affect. Her behavior is normal. Judgment and thought content  normal.  Vitals reviewed.         Assessment & Plan:  Essential hypertension-add HCTZ to Tenormin and reevaluate in 3 weeks with office visit blood pressure check and basic metabolic panel  Hyperlipidemia--stable on statin therapy  Obesity-these to work on diet and exercise  Impaired glucose tolerance-add hemoglobin A1c  Attention deficit  disorder-under treatment  Restless leg syndrome-under treatment  Anxiety depression-stable on treatment  Cystic lesion left index finger. Has seen Dr. Daylene Katayama in the past regarding this. Would like to have something done about it. Advised patient to call Dr. Daryll Brod.  Health maintenance-needs screening colonoscopy and referral will be made.

## 2015-06-21 LAB — HEMOGLOBIN A1C
HEMOGLOBIN A1C: 5.6 % (ref <5.7)
MEAN PLASMA GLUCOSE: 114 mg/dL

## 2015-07-11 ENCOUNTER — Ambulatory Visit: Payer: BLUE CROSS/BLUE SHIELD | Admitting: Internal Medicine

## 2015-07-11 ENCOUNTER — Ambulatory Visit (INDEPENDENT_AMBULATORY_CARE_PROVIDER_SITE_OTHER): Payer: BLUE CROSS/BLUE SHIELD | Admitting: Internal Medicine

## 2015-07-11 ENCOUNTER — Encounter: Payer: Self-pay | Admitting: Internal Medicine

## 2015-07-11 VITALS — BP 166/104 | HR 88 | Temp 97.9°F | Resp 18 | Wt 177.0 lb

## 2015-07-11 DIAGNOSIS — I10 Essential (primary) hypertension: Secondary | ICD-10-CM

## 2015-07-11 DIAGNOSIS — M5416 Radiculopathy, lumbar region: Secondary | ICD-10-CM

## 2015-07-11 DIAGNOSIS — M5417 Radiculopathy, lumbosacral region: Secondary | ICD-10-CM | POA: Diagnosis not present

## 2015-07-11 MED ORDER — HYDROCODONE-ACETAMINOPHEN 10-325 MG PO TABS: 1.0000 | ORAL_TABLET | Freq: Three times a day (TID) | ORAL | Status: DC | PRN

## 2015-07-11 MED ORDER — AMLODIPINE BESYLATE 5 MG PO TABS: 5.0000 mg | ORAL_TABLET | Freq: Every day | ORAL | Status: DC

## 2015-07-23 NOTE — Patient Instructions (Addendum)
Refill hydrocodone/APAP for low back pain. Stop HCTZ. Continue Tenormin and add amlodipine 5 mg daily. Return May 8.

## 2015-07-23 NOTE — Progress Notes (Signed)
   Subjective:    Patient ID: Tabbetha Aundra Dubin, female    DOB: 08/02/1955, 60 y.o.   MRN: [id number]  HPI At last visit patient was prescribed HCTZ 25 mg daily to take along with Tenormin. She said it made her nauseated and she quit taking it. Blood pressure is elevated today at 166/104. She's having recurrent issues with back pain. Feels that she reinjured her back.    Review of Systems     Objective:   Physical Exam  Skin warm and dry. Neck supple without thyromegaly JVD or carotid bruits. Chest clear to auscultation. Cardiac exam regular rate and rhythm normal S1 and S2. Extremities without edema. Straight leg raising at 90 is negative bilaterally and muscle strength is normal in the lower extremities    Recurrent low back pain  Plan    Assessment & Plan:  Intolerant of HCTZ  Recurrent back pain  Essential hypertension  History of hyperlipidemia  Plan: Refill hydrocodone/APAP for back pain. Discontinue HCTZ. Trial of amlodipine 5 mg daily. Continue Tenormin. Return May 8.

## 2015-07-24 DIAGNOSIS — L409 Psoriasis, unspecified: Secondary | ICD-10-CM | POA: Insufficient documentation

## 2015-07-31 ENCOUNTER — Encounter: Payer: Self-pay | Admitting: Internal Medicine

## 2015-07-31 ENCOUNTER — Ambulatory Visit (INDEPENDENT_AMBULATORY_CARE_PROVIDER_SITE_OTHER): Payer: BLUE CROSS/BLUE SHIELD | Admitting: Internal Medicine

## 2015-07-31 VITALS — BP 120/80 | HR 80 | Temp 98.0°F | Resp 18 | Ht 67.0 in | Wt 173.5 lb

## 2015-07-31 DIAGNOSIS — I10 Essential (primary) hypertension: Secondary | ICD-10-CM | POA: Diagnosis not present

## 2015-07-31 DIAGNOSIS — F411 Generalized anxiety disorder: Secondary | ICD-10-CM | POA: Diagnosis not present

## 2015-07-31 DIAGNOSIS — F988 Other specified behavioral and emotional disorders with onset usually occurring in childhood and adolescence: Secondary | ICD-10-CM

## 2015-07-31 DIAGNOSIS — F909 Attention-deficit hyperactivity disorder, unspecified type: Secondary | ICD-10-CM

## 2015-07-31 DIAGNOSIS — E669 Obesity, unspecified: Secondary | ICD-10-CM

## 2015-08-04 ENCOUNTER — Ambulatory Visit (AMBULATORY_SURGERY_CENTER): Payer: Self-pay | Admitting: *Deleted

## 2015-08-04 VITALS — Ht 67.0 in | Wt 174.0 lb

## 2015-08-04 DIAGNOSIS — Z1211 Encounter for screening for malignant neoplasm of colon: Secondary | ICD-10-CM

## 2015-08-04 NOTE — Progress Notes (Signed)
No egg or soy allergy known to patient  No issues with past sedation with any surgeries  or procedures, no intubation problems  No diet pills per patient No home 02 use per patient  No blood thinners per patient  Pt denies issues with constipation   

## 2015-08-07 ENCOUNTER — Encounter: Payer: Self-pay | Admitting: Gastroenterology

## 2015-08-17 ENCOUNTER — Encounter: Payer: Self-pay | Admitting: Gastroenterology

## 2015-08-17 ENCOUNTER — Ambulatory Visit (AMBULATORY_SURGERY_CENTER): Payer: BLUE CROSS/BLUE SHIELD | Admitting: Gastroenterology

## 2015-08-17 VITALS — BP 127/82 | HR 85 | Temp 98.4°F | Resp 12 | Ht 67.0 in | Wt 174.0 lb

## 2015-08-17 DIAGNOSIS — K621 Rectal polyp: Secondary | ICD-10-CM

## 2015-08-17 DIAGNOSIS — D123 Benign neoplasm of transverse colon: Secondary | ICD-10-CM | POA: Diagnosis not present

## 2015-08-17 DIAGNOSIS — Z1211 Encounter for screening for malignant neoplasm of colon: Secondary | ICD-10-CM

## 2015-08-17 DIAGNOSIS — D128 Benign neoplasm of rectum: Secondary | ICD-10-CM

## 2015-08-17 MED ORDER — SODIUM CHLORIDE 0.9 % IV SOLN
500.0000 mL | INTRAVENOUS | Status: DC
Start: 2015-08-17 — End: 2015-08-17

## 2015-08-17 NOTE — Patient Instructions (Signed)
YOU HAD AN ENDOSCOPIC PROCEDURE TODAY AT [facility]:   Refer to the procedure report that was given to you for any specific questions about what was found during the examination.  If the procedure report does not answer your questions, please call your gastroenterologist to clarify.  If you requested that your care partner not be given the details of your procedure findings, then the procedure report has been included in a sealed envelope for you to review at your convenience later.  YOU SHOULD EXPECT: Some feelings of bloating in the abdomen. Passage of more gas than usual.  Walking can help get rid of the air that was put into your GI tract during the procedure and reduce the bloating. If you had a lower endoscopy (such as a colonoscopy or flexible sigmoidoscopy) you may notice spotting of blood in your stool or on the toilet paper. If you underwent a bowel prep for your procedure, you may not have a normal bowel movement for a few days.  Please Note:  You might notice some irritation and congestion in your nose or some drainage.  This is from the oxygen used during your procedure.  There is no need for concern and it should clear up in a day or so.  SYMPTOMS TO REPORT IMMEDIATELY:   Following lower endoscopy (colonoscopy or flexible sigmoidoscopy):  Excessive amounts of blood in the stool  Significant tenderness or worsening of abdominal pains  Swelling of the abdomen that is new, acute  Fever of 100F or higher   For urgent or emergent issues, a gastroenterologist can be reached at any hour by calling [phone].   DIET: Your first meal following the procedure should be a small meal and then it is ok to progress to your normal diet. Heavy or fried foods are harder to digest and may make you feel nauseous or bloated.  Likewise, meals heavy in dairy and vegetables can increase bloating.  Drink plenty of fluids but you should avoid alcoholic beverages for 24  hours.  ACTIVITY:  You should plan to take it easy for the rest of today and you should NOT DRIVE or use heavy machinery until tomorrow (because of the sedation medicines used during the test).    FOLLOW UP: Our staff will call the number listed on your records the next business day following your procedure to check on you and address any questions or concerns that you may have regarding the information given to you following your procedure. If we do not reach you, we will leave a message.  However, if you are feeling well and you are not experiencing any problems, there is no need to return our call.  We will assume that you have returned to your regular daily activities without incident.  If any biopsies were taken you will be contacted by phone or by letter within the next 1-3 weeks.  Please call us at [phone] if you have not heard about the biopsies in 3 weeks.    SIGNATURES/CONFIDENTIALITY: You and/or your care partner have signed paperwork which will be entered into your electronic medical record.  These signatures attest to the fact that that the information above on your After Visit Summary has been reviewed and is understood.  Full responsibility of the confidentiality of this discharge information lies with you and/or your care-partner.   No Aspirin, Ibuprofen,naproxen,or non-steroidal anti-inflammatory for 2 weeks,resume remainder of medications. Information given on polyps,diverticulosis,hemorrhoids and high fiber diet.

## 2015-08-17 NOTE — Progress Notes (Signed)
Called to room to assist during endoscopic procedure.  Patient ID and intended procedure confirmed with present staff. Received instructions for my participation in the procedure from the performing physician.  

## 2015-08-17 NOTE — Progress Notes (Signed)
Patient stating she feels her anxiety level rising. Attempted to calm patient and reassure her.

## 2015-08-17 NOTE — Op Note (Addendum)
[place] Patient Name: Crystal Blake Procedure Date: 08/17/2015 1:20 PM MRN: [id number] Endoscopist: Remo Lipps P. Havery Moros , MD Age: 60 Referring MD:  Date of Birth: [birth year] Gender: Female Procedure:                Colonoscopy Indications:              Screening for malignant neoplasm in the colon, This                            is the patient's first colonoscopy Medicines:                Monitored Anesthesia Care Procedure:                Pre-Anesthesia Assessment:                           - Prior to the procedure, a History and Physical                            was performed, and patient medications and                            allergies were reviewed. The patient's tolerance of                            previous anesthesia was also reviewed. The risks                            and benefits of the procedure and the sedation                            options and risks were discussed with the patient.                            All questions were answered, and informed consent                            was obtained. Prior Anticoagulants: The patient has                            taken no previous anticoagulant or antiplatelet                            agents. ASA Grade Assessment: II - A patient with                            mild systemic disease. After reviewing the risks                            and benefits, the patient was deemed in                            satisfactory condition to undergo the procedure.  After obtaining informed consent, the colonoscope                            was passed under direct vision. Throughout the                            procedure, the patient's blood pressure, pulse, and                            oxygen saturations were monitored continuously. The                            Model CF-HQ190L [id number]) scope was introduced                            through the anus and advanced to the the  cecum,                            identified by appendiceal orifice and ileocecal                            valve. The colonoscopy was technically difficult                            and complex due to restricted mobility of the colon                            and significant looping. The patient tolerated the                            procedure well. The quality of the bowel                            preparation was good. The ileocecal valve,                            appendiceal orifice, and rectum were photographed. Scope In: 1:32:55 PM Scope Out: 2:00:06 PM Scope Withdrawal Time: 0 hours 16 minutes 54 seconds  Total Procedure Duration: 0 hours 27 minutes 11 seconds  Findings:                 The perianal and digital rectal examinations were                            normal.                           Many small and large-mouthed diverticula were found                            in the transverse colon and left colon.                           A 4 mm polyp was found in the hepatic flexure. The  polyp was sessile. The polyp was removed with a                            cold snare. Resection and retrieval were complete.                           A 5 mm polyp was found in the transverse colon. The                            polyp was sessile. The polyp was removed with a                            cold snare. Resection and retrieval were complete.                           A 4 mm polyp was found in the rectum. The polyp was                            sessile. The polyp was removed with a cold snare.                            Resection and retrieval were complete.                           The colon was significantly tortuous, and with                            restricted mobility of the left colon I suspect due                            to diverticulosis or adhesions.                           Internal hemorrhoids were found during                             retroflexion. The hemorrhoids were small.                           The exam was otherwise without abnormality. Complications:            No immediate complications. Estimated blood loss:                            Minimal. Estimated Blood Loss:     Estimated blood loss was minimal. Impression:               - Diverticulosis in the transverse colon and in the                            left colon.                           - One 4 mm polyp at the hepatic flexure, removed  with a cold snare. Resected and retrieved.                           - One 5 mm polyp in the transverse colon, removed                            with a cold snare. Resected and retrieved.                           - One 4 mm polyp in the rectum, removed with a cold                            snare. Resected and retrieved.                           - Tortuous colon with restricted mobility.                           - Internal hemorrhoids.                           - The examination was otherwise normal. Recommendation:           - Patient has a contact number available for                            emergencies. The signs and symptoms of potential                            delayed complications were discussed with the                            patient. Return to normal activities tomorrow.                            Written discharge instructions were provided to the                            patient.                           - Resume previous diet.                           - Continue present medications.                           - No aspirin, ibuprofen, naproxen, or other                            non-steroidal anti-inflammatory drugs for 2 weeks                            after polyp removal.                           -  Await pathology results.                           - Repeat colonoscopy is recommended for                            surveillance. The colonoscopy date will be                             determined after pathology results from today's                            exam become available for review. Remo Lipps P. Havery Moros, MD 08/17/2015 2:06:35 PM This report has been signed electronically. Addendum Number: 1   Addendum Date: 08/17/2015 2:12:09 PM      Of note, the patient required a significant amount of sedation including       propofol, versed, and fentyl to make her comfortable from this       procedure. There were very angulated areas of the sigmoid with stricted       mobility, no obvious high risk lesions noted although due to       angulations, some areas were not well visualized in the left colon. Remo Lipps P. Breean Nannini, MD 08/17/2015 2:13:34 PM This report has been signed electronically.

## 2015-08-17 NOTE — Progress Notes (Signed)
Pt requiring IV Diprivan, Versed and Fentanyl - screaming / expressing pain during during exam requiring more than IV Diprivan. Stable once heavily sedated - no airway issues Taken to RR stable VS- sleeping

## 2015-08-18 ENCOUNTER — Telehealth: Payer: Self-pay | Admitting: *Deleted

## 2015-08-18 NOTE — Telephone Encounter (Signed)
Number identifier, left message, follow-up  

## 2015-08-19 ENCOUNTER — Encounter: Payer: Self-pay | Admitting: Internal Medicine

## 2015-08-19 NOTE — Patient Instructions (Addendum)
Continue amlodipine and Tenormin. Return in August.

## 2015-08-19 NOTE — Progress Notes (Signed)
   Subjective:    Patient ID: Jasmin Aundra Dubin, female    DOB: August 30, 1955, 60 y.o.   MRN: [id number]  HPI In today to follow-up on essential hypertension. Did not tolerate HCTZ which was prescribed at time of physical examination. She said it made her nauseated. When she returned April 18, blood pressure was elevated at 166/104. She was having issues with recurrent back pain. Hydrocodone/APAP was refilled. HCTZ was discontinued. She was started on amlodipine 5 mg daily. Tenormin was continued. Patient says she's trying to come off of attention deficit medication. Says she's having trouble with muddled thinking and anxiety. Back pain has improved. She takes Tranxene for anxiety.    Review of Systems see above     Objective:   Physical Exam  Neck is supple without JVD thyromegaly or carotid bruits. Chest clear to auscultation. Cardiac exam regular rate and rhythm. Extremities without edema.      Assessment & Plan:  Essential hypertension  Anxiety  History of attention deficit disorder  History of lumbar back pain  Plan: Patient to continue with amlodipine 5 mg daily and to [place] 50 mg daily. Return for follow-up in August. Is to monitor blood pressure at home.

## 2015-08-25 ENCOUNTER — Encounter: Payer: Self-pay | Admitting: Gastroenterology

## 2015-09-20 DIAGNOSIS — F900 Attention-deficit hyperactivity disorder, predominantly inattentive type: Secondary | ICD-10-CM | POA: Diagnosis not present

## 2015-09-20 DIAGNOSIS — F331 Major depressive disorder, recurrent, moderate: Secondary | ICD-10-CM | POA: Diagnosis not present

## 2015-09-20 DIAGNOSIS — F410 Panic disorder [episodic paroxysmal anxiety] without agoraphobia: Secondary | ICD-10-CM | POA: Diagnosis not present

## 2015-10-04 DIAGNOSIS — H25013 Cortical age-related cataract, bilateral: Secondary | ICD-10-CM | POA: Diagnosis not present

## 2015-10-04 DIAGNOSIS — H01001 Unspecified blepharitis right upper eyelid: Secondary | ICD-10-CM | POA: Diagnosis not present

## 2015-10-04 DIAGNOSIS — H2513 Age-related nuclear cataract, bilateral: Secondary | ICD-10-CM | POA: Diagnosis not present

## 2015-10-04 DIAGNOSIS — H5202 Hypermetropia, left eye: Secondary | ICD-10-CM | POA: Diagnosis not present

## 2015-11-02 ENCOUNTER — Ambulatory Visit (INDEPENDENT_AMBULATORY_CARE_PROVIDER_SITE_OTHER): Payer: BLUE CROSS/BLUE SHIELD | Admitting: Internal Medicine

## 2015-11-02 ENCOUNTER — Encounter: Payer: Self-pay | Admitting: Internal Medicine

## 2015-11-02 VITALS — BP 134/82 | HR 108 | Temp 98.8°F | Ht 67.0 in | Wt 173.0 lb

## 2015-11-02 DIAGNOSIS — F411 Generalized anxiety disorder: Secondary | ICD-10-CM | POA: Diagnosis not present

## 2015-11-02 DIAGNOSIS — G2581 Restless legs syndrome: Secondary | ICD-10-CM

## 2015-11-02 DIAGNOSIS — F909 Attention-deficit hyperactivity disorder, unspecified type: Secondary | ICD-10-CM | POA: Diagnosis not present

## 2015-11-02 DIAGNOSIS — G8929 Other chronic pain: Secondary | ICD-10-CM | POA: Diagnosis not present

## 2015-11-02 DIAGNOSIS — I10 Essential (primary) hypertension: Secondary | ICD-10-CM

## 2015-11-02 DIAGNOSIS — M545 Low back pain, unspecified: Secondary | ICD-10-CM

## 2015-11-02 DIAGNOSIS — L409 Psoriasis, unspecified: Secondary | ICD-10-CM

## 2015-11-02 DIAGNOSIS — E785 Hyperlipidemia, unspecified: Secondary | ICD-10-CM

## 2015-11-02 DIAGNOSIS — E8881 Metabolic syndrome: Secondary | ICD-10-CM

## 2015-11-02 DIAGNOSIS — F988 Other specified behavioral and emotional disorders with onset usually occurring in childhood and adolescence: Secondary | ICD-10-CM

## 2015-11-02 DIAGNOSIS — E669 Obesity, unspecified: Secondary | ICD-10-CM

## 2015-11-02 MED ORDER — KETOCONAZOLE 2 % EX SHAM: 1.0000 "application " | MEDICATED_SHAMPOO | CUTANEOUS | 0 refills | Status: DC

## 2015-11-11 ENCOUNTER — Other Ambulatory Visit: Payer: Self-pay | Admitting: Internal Medicine

## 2015-11-19 ENCOUNTER — Encounter: Payer: Self-pay | Admitting: Internal Medicine

## 2015-11-19 NOTE — Patient Instructions (Addendum)
Continue same medications. Amlodipine is tolerated. Work on diet exercise and weight loss. Nizoral shampoo prescribed. Return in March for physical examination.

## 2015-11-19 NOTE — Progress Notes (Signed)
   Subjective:    Patient ID: Crystal Blake, female    DOB: October 27, 1955, 60 y.o.   MRN: [id number]  HPI In March she was here for routine physical exam. Her blood pressure was elevated and we started her on HCTZ for hypertension in addition to Tenormin. She did not tolerate it saying it causes nausea and vomiting. Subsequently switched to amlodipine which she has tolerated.  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 Have prescribed Nizoral shampoo.  Regarding low back pain is stable but flares up from time to time.  Both daughters are living in [place] but not together.  Continues on Lipitor. Takes ibuprofen for back pain is well. History of restless leg syndrome treated with Requip.   Has history of attention deficit disorder and depression. Is on Lexapro. Takes hydrocodone a Pap sparingly for back pain.    Review of Systems as above     Objective:   Physical Exam  Neck is supple without JVD thyromegaly or carotid bruits. Chest clear. Cardiac exam regular rate and rhythm. Extremities without edema.      Assessment & Plan:  Essential hypertension-stable on current regimen  Obesity  Hyperlipidemia-treated with statin.History of elevated triglycerides. Needs to take diet and exercise seriously. Continue statin medication.  Attention deficit disorder  Chronic recurrent low back pain treated with ibuprofen and hydrocodone/APAP  Seborrhea of scalp-prescribed Nizoral shampoo  Plan: Return in March 2018 for physical examination and fasting lab work. Encouraged diet exercise and weight loss.

## 2015-11-29 ENCOUNTER — Ambulatory Visit: Payer: BLUE CROSS/BLUE SHIELD | Admitting: Podiatry

## 2015-12-01 ENCOUNTER — Ambulatory Visit (INDEPENDENT_AMBULATORY_CARE_PROVIDER_SITE_OTHER): Payer: BLUE CROSS/BLUE SHIELD | Admitting: Podiatry

## 2015-12-01 ENCOUNTER — Encounter: Payer: Self-pay | Admitting: Podiatry

## 2015-12-01 ENCOUNTER — Ambulatory Visit (INDEPENDENT_AMBULATORY_CARE_PROVIDER_SITE_OTHER): Payer: BLUE CROSS/BLUE SHIELD

## 2015-12-01 VITALS — BP 136/80 | HR 75 | Resp 16 | Ht 67.0 in | Wt 165.0 lb

## 2015-12-01 DIAGNOSIS — M79671 Pain in right foot: Secondary | ICD-10-CM

## 2015-12-01 DIAGNOSIS — M79672 Pain in left foot: Secondary | ICD-10-CM

## 2015-12-01 DIAGNOSIS — M205X9 Other deformities of toe(s) (acquired), unspecified foot: Secondary | ICD-10-CM | POA: Diagnosis not present

## 2015-12-01 DIAGNOSIS — M21629 Bunionette of unspecified foot: Secondary | ICD-10-CM | POA: Diagnosis not present

## 2015-12-01 NOTE — Progress Notes (Signed)
   Subjective:    Patient ID: Crystal Blake, female    DOB: 1955/04/24, 60 y.o.   MRN: [id number]  HPI  Chief Complaint  Patient presents with  . Foot Pain    bilateral 5th toe joint x 1 yr with the L being the worst  . Toe Pain    right 2nd bump..."not really painful, but can cause a blister"      Review of Systems  All other systems reviewed and are negative.      Objective:   Physical Exam        Assessment & Plan:

## 2015-12-01 NOTE — Patient Instructions (Signed)
Pre-Operative Instructions  Congratulations, you have decided to take an important step to improving your quality of life.  You can be assured that the doctors of [facility] will be with you every step of the way.  1. Plan to be at the surgery center/hospital at least 1 (one) hour prior to your scheduled time unless otherwise directed by the surgical center/hospital staff.  You must have a responsible adult accompany you, remain during the surgery and drive you home.  Make sure you have directions to the surgical center/hospital and know how to get there on time. 2. For hospital based surgery you will need to obtain a history and physical form from your family physician within 1 month prior to the date of surgery- we will give you a form for you primary physician.  3. We make every effort to accommodate the date you request for surgery.  There are however, times where surgery dates or times have to be moved.  We will contact you as soon as possible if a change in schedule is required.   4. No Aspirin/Ibuprofen for one week before surgery.  If you are on aspirin, any non-steroidal anti-inflammatory medications (Mobic, Aleve, Ibuprofen) you should stop taking it 7 days prior to your surgery.  You make take Tylenol  For pain prior to surgery.  5. Medications- If you are taking daily heart and blood pressure medications, seizure, reflux, allergy, asthma, anxiety, pain or diabetes medications, make sure the surgery center/hospital is aware before the day of surgery so they may notify you which medications to take or avoid the day of surgery. 6. No food or drink after midnight the night before surgery unless directed otherwise by surgical center/hospital staff. 7. No alcoholic beverages 24 hours prior to surgery.  No smoking 24 hours prior to or 24 hours after surgery. 8. Wear loose pants or shorts- loose enough to fit over bandages, boots, and casts. 9. No slip on shoes, sneakers are best. 10. Bring  your boot with you to the surgery center/hospital.  Also bring crutches or a walker if your physician has prescribed it for you.  If you do not have this equipment, it will be provided for you after surgery. 11. If you have not been contracted by the surgery center/hospital by the day before your surgery, call to confirm the date and time of your surgery. 12. Leave-time from work may vary depending on the type of surgery you have.  Appropriate arrangements should be made prior to surgery with your employer. 13. Prescriptions will be provided immediately following surgery by your doctor.  Have these filled as soon as possible after surgery and take the medication as directed. 14. Remove nail polish on the operative foot. 15. Wash the night before surgery.  The night before surgery wash the foot and leg well with the antibacterial soap provided and water paying special attention to beneath the toenails and in between the toes.  Rinse thoroughly with water and dry well with a towel.  Perform this wash unless told not to do so by your physician.  Enclosed: 1 Ice pack (please put in freezer the night before surgery)   1 Hibiclens skin cleaner   Pre-op Instructions  If you have any questions regarding the instructions, do not hesitate to call our office.  [place]: [address] [place], [place] [address] [phone]  [place]: [address] [place], [place] [address] [phone]  [place]: [address]  [place], [place] [address] [phone]   Dr.   Norman Regal DPM, Dr. Matthew Wagoner DPM, Dr. M. Todd Hyatt DPM, Dr. Titorya Stover DPM 

## 2015-12-03 NOTE — Progress Notes (Signed)
Subjective:     Patient ID: Crystal Blake, female   DOB: October 17, 1955, 60 y.o.   MRN: [id number]  HPI patient presents stating she's had a lot of problems in her left over right foot on the outside and it's hard to wear shoe gear comfortably and she wanted to get her big toe joints checked as they've given her trouble periodically   Review of Systems     Objective:   Physical Exam Neurovascular status intact muscle strength adequate range of motion within normal limits with patient found to have redness and pain around the fifth metatarsal head left with reduced range of motion first MPJ right over left but no crepitus in the joints and well healed surgical sites. 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 Explained procedure and risk and patient wants surgery and today I went ahead and I out her to read consent form going over procedure complications and alternative treatments. She is perfectly comfortable with this due to her previous surgery and is scheduled for outpatient surgery in the next several weeks and is encouraged to call with questions and had air fracture walker dispensed for the postoperative period  X-ray indicated there is some compression of the first MPJ bilateral but no reoccurrence of spurring noted and there is no elevation of the intermetatarsal angle between the fourth and fifth metatarsals left over right

## 2015-12-04 ENCOUNTER — Other Ambulatory Visit: Payer: Self-pay | Admitting: Internal Medicine

## 2015-12-25 ENCOUNTER — Telehealth: Payer: Self-pay | Admitting: *Deleted

## 2015-12-25 NOTE — Telephone Encounter (Signed)
"  I was scheduled to have surgery on 12/19/2015.  I had to postpone it.  I want to reschedule it.  I am hoping Dr. Paulla Dolly has something the afternoon of Tuesday October 24.  If not we'll have to work out something else.  Could you please call me so we can work something out. Please call me back and let me know. I've been going back and forth to [place] a lot an need to know if I need to make sure I have enough time after surgery.  Please give me a call.

## 2015-12-26 NOTE — Telephone Encounter (Signed)
"  I called Friday. I need to reschedule my surgery that was originally scheduled for September 26.  I would like to do it 10/24 in the afternoon if something is available.  I need to get on the schedule.  Please let me know as soon as possible.  Thank you."  I am returning your call.  Dr. Paulla Dolly can do surgery on October 24 but it will not be in the afternoon.  "What time will it be?"  It will be sometime that morning.  "When will I know, the day before?"  It may be the Friday or Monday before.  "I will take that date, I will just have to adjust.  I have back issues.  I will work it out.  Thank you so much."

## 2016-01-16 ENCOUNTER — Encounter: Payer: Self-pay | Admitting: Podiatry

## 2016-01-16 DIAGNOSIS — M2012 Hallux valgus (acquired), left foot: Secondary | ICD-10-CM | POA: Diagnosis not present

## 2016-01-16 DIAGNOSIS — M21622 Bunionette of left foot: Secondary | ICD-10-CM | POA: Diagnosis not present

## 2016-01-16 DIAGNOSIS — E7800 Pure hypercholesterolemia, unspecified: Secondary | ICD-10-CM | POA: Diagnosis not present

## 2016-01-16 DIAGNOSIS — M21612 Bunion of left foot: Secondary | ICD-10-CM | POA: Diagnosis not present

## 2016-01-19 ENCOUNTER — Other Ambulatory Visit: Payer: Self-pay | Admitting: Internal Medicine

## 2016-01-19 NOTE — Progress Notes (Signed)
DOS 10.24.2017 5th Metatarsal Osteotomy with Screw 5th Left

## 2016-01-25 ENCOUNTER — Ambulatory Visit (INDEPENDENT_AMBULATORY_CARE_PROVIDER_SITE_OTHER): Payer: BLUE CROSS/BLUE SHIELD

## 2016-01-25 ENCOUNTER — Ambulatory Visit (INDEPENDENT_AMBULATORY_CARE_PROVIDER_SITE_OTHER): Payer: BLUE CROSS/BLUE SHIELD | Admitting: Podiatry

## 2016-01-25 VITALS — BP 144/99 | HR 95 | Temp 99.2°F

## 2016-01-25 DIAGNOSIS — M21629 Bunionette of unspecified foot: Secondary | ICD-10-CM

## 2016-01-25 DIAGNOSIS — M205X9 Other deformities of toe(s) (acquired), unspecified foot: Secondary | ICD-10-CM

## 2016-01-25 DIAGNOSIS — Z9889 Other specified postprocedural states: Secondary | ICD-10-CM

## 2016-01-25 MED ORDER — OXYCODONE-ACETAMINOPHEN 10-325 MG PO TABS: 1.0000 | ORAL_TABLET | ORAL | 0 refills | Status: DC | PRN

## 2016-01-25 NOTE — Progress Notes (Signed)
She presents today a little more than 1 week status post fifth metatarsal osteotomy left foot. She states that everything seems to be going well she's had no pain. She denies fever chills nausea vomiting muscle aches and pains.  Objective: Resents today in her Darco shoe dry sterile dressing intact was removed demonstrates mild edema overlying the dorsolateral aspect of left foot pulses remain palpable pain. Sutures are intact are well coapted radiograph demonstrates a 30 screw and a single K wire to the fifth metatarsal head left foot Fragment Is in [place] Position. No Signs of Bone Infection No Signs of Skin Infection.  Assessment: Well-Healed Surgical Foot Left.  Plan: I Encouraged Redress Today Dresser Compressive Dressing Continued Use of the [employer] and Will Follow-Up with Her in 1 Week for Suture Removal.

## 2016-01-31 ENCOUNTER — Ambulatory Visit (INDEPENDENT_AMBULATORY_CARE_PROVIDER_SITE_OTHER): Payer: BLUE CROSS/BLUE SHIELD | Admitting: Podiatry

## 2016-01-31 DIAGNOSIS — M205X9 Other deformities of toe(s) (acquired), unspecified foot: Secondary | ICD-10-CM | POA: Diagnosis not present

## 2016-01-31 DIAGNOSIS — M21629 Bunionette of unspecified foot: Secondary | ICD-10-CM

## 2016-01-31 DIAGNOSIS — Z9889 Other specified postprocedural states: Secondary | ICD-10-CM

## 2016-01-31 NOTE — Progress Notes (Signed)
Subjective:     Patient ID: Crystal Blake, female   DOB: 09-30-1955, 60 y.o.   MRN: [id number]  HPI patient states I'm doing really well with my left foot with minimal discomfort or swelling   Review of Systems     Objective:   Physical Exam Neurovascular intact negative Homans sign noted with well coapted incision site fifth metatarsal left with minimal edema noted and good alignment    Assessment:     Doing well with post osteotomy fifth metatarsal left with wound edges well coapted    Plan:     Instructed on continued elevation compression and dispensed Ace wrap and anklet compression stocking and continue with wedge shoe or boot to keep pressure off this and reappoint 3 weeks or earlier if needed

## 2016-02-12 ENCOUNTER — Other Ambulatory Visit: Payer: Self-pay | Admitting: Internal Medicine

## 2016-02-12 ENCOUNTER — Ambulatory Visit (INDEPENDENT_AMBULATORY_CARE_PROVIDER_SITE_OTHER): Payer: BLUE CROSS/BLUE SHIELD | Admitting: Podiatry

## 2016-02-12 ENCOUNTER — Ambulatory Visit (INDEPENDENT_AMBULATORY_CARE_PROVIDER_SITE_OTHER): Payer: BLUE CROSS/BLUE SHIELD

## 2016-02-12 DIAGNOSIS — Z9889 Other specified postprocedural states: Secondary | ICD-10-CM

## 2016-02-12 DIAGNOSIS — M21629 Bunionette of unspecified foot: Secondary | ICD-10-CM | POA: Diagnosis not present

## 2016-02-12 DIAGNOSIS — M205X9 Other deformities of toe(s) (acquired), unspecified foot: Secondary | ICD-10-CM | POA: Diagnosis not present

## 2016-02-12 MED ORDER — AMLODIPINE BESYLATE 5 MG PO TABS: 5.0000 mg | ORAL_TABLET | Freq: Every day | ORAL | 1 refills | Status: DC

## 2016-02-12 NOTE — Progress Notes (Signed)
Subjective:     Patient ID: Crystal Blake, female   DOB: 08-23-1955, 60 y.o.   MRN: [id number]  HPI patient presents with fifth metatarsal that was fixed approximate 4 weeks ago and admits that she doesn't wear her boot all the time at home   Review of Systems     Objective:   Physical Exam Neurovascular status intact negative Homan sign was noted with patient found to have well-healed surgical site fifth metatarsal left with mild increased swelling but no other pathology noted    Assessment:     Healing well from fifth metatarsal osteotomy left with possible for some stress on the osteotomy secondary to noncompliance    Plan:     X-rays taken reviewed and advised on continued immobilization for approximately 2 more weeks with ice therapy and not going barefoot and not bearing significant weight against this area. Patient be seen back to recheck 4 weeks or earlier if needed  X-ray indicated screw and pin are in place and there does not appear to be movement of the bone and appears to be healing satisfactorily

## 2016-02-17 ENCOUNTER — Other Ambulatory Visit: Payer: Self-pay | Admitting: Internal Medicine

## 2016-03-15 DIAGNOSIS — F900 Attention-deficit hyperactivity disorder, predominantly inattentive type: Secondary | ICD-10-CM | POA: Diagnosis not present

## 2016-03-15 DIAGNOSIS — F4321 Adjustment disorder with depressed mood: Secondary | ICD-10-CM | POA: Diagnosis not present

## 2016-03-22 ENCOUNTER — Ambulatory Visit (INDEPENDENT_AMBULATORY_CARE_PROVIDER_SITE_OTHER): Payer: BLUE CROSS/BLUE SHIELD

## 2016-03-22 ENCOUNTER — Encounter: Payer: Self-pay | Admitting: Podiatry

## 2016-03-22 ENCOUNTER — Ambulatory Visit (INDEPENDENT_AMBULATORY_CARE_PROVIDER_SITE_OTHER): Payer: BLUE CROSS/BLUE SHIELD | Admitting: Podiatry

## 2016-03-22 DIAGNOSIS — Z9889 Other specified postprocedural states: Secondary | ICD-10-CM

## 2016-03-22 DIAGNOSIS — M21629 Bunionette of unspecified foot: Secondary | ICD-10-CM

## 2016-03-22 DIAGNOSIS — M2012 Hallux valgus (acquired), left foot: Secondary | ICD-10-CM | POA: Diagnosis not present

## 2016-03-22 NOTE — Progress Notes (Signed)
Subjective:     Patient ID: Crystal Blake, female   DOB: 09-16-1955, 60 y.o.   MRN: [id number]  HPI patient presents stating that her right foot is doing so much better with minimal swelling and she's very pleased   Review of Systems     Objective:   Physical Exam Neurovascular status intact muscle strength adequate with significant reduction discomfort fifth MPJ right with wound edges well coapted and good alignment noted    Assessment:     Doing well post osteotomy right fifth metatarsal    Plan:     Final x-rays reviewed indicating good alignment with pin screw intact and structural correction good patient is discharge will be seen back as needed  X-ray indicates that the pin and the screw are healing well and the fifth metatarsals going to final stages of complete healing and should heal uneventfully

## 2016-03-25 DIAGNOSIS — R053 Chronic cough: Secondary | ICD-10-CM | POA: Insufficient documentation

## 2016-04-16 ENCOUNTER — Encounter: Payer: Self-pay | Admitting: Internal Medicine

## 2016-04-16 ENCOUNTER — Ambulatory Visit (INDEPENDENT_AMBULATORY_CARE_PROVIDER_SITE_OTHER): Payer: BLUE CROSS/BLUE SHIELD | Admitting: Internal Medicine

## 2016-04-16 VITALS — BP 138/98 | HR 80 | Temp 99.6°F | Ht 67.75 in | Wt 178.0 lb

## 2016-04-16 DIAGNOSIS — R319 Hematuria, unspecified: Secondary | ICD-10-CM | POA: Diagnosis not present

## 2016-04-16 DIAGNOSIS — K5900 Constipation, unspecified: Secondary | ICD-10-CM

## 2016-04-16 DIAGNOSIS — R1032 Left lower quadrant pain: Secondary | ICD-10-CM

## 2016-04-16 DIAGNOSIS — F419 Anxiety disorder, unspecified: Secondary | ICD-10-CM

## 2016-04-16 DIAGNOSIS — F418 Other specified anxiety disorders: Secondary | ICD-10-CM

## 2016-04-16 DIAGNOSIS — L409 Psoriasis, unspecified: Secondary | ICD-10-CM | POA: Diagnosis not present

## 2016-04-16 DIAGNOSIS — F439 Reaction to severe stress, unspecified: Secondary | ICD-10-CM | POA: Diagnosis not present

## 2016-04-16 DIAGNOSIS — F900 Attention-deficit hyperactivity disorder, predominantly inattentive type: Secondary | ICD-10-CM

## 2016-04-16 DIAGNOSIS — M545 Low back pain: Secondary | ICD-10-CM

## 2016-04-16 DIAGNOSIS — I10 Essential (primary) hypertension: Secondary | ICD-10-CM

## 2016-04-16 DIAGNOSIS — F32A Depression, unspecified: Secondary | ICD-10-CM

## 2016-04-16 DIAGNOSIS — F329 Major depressive disorder, single episode, unspecified: Secondary | ICD-10-CM

## 2016-04-16 LAB — POC URINALSYSI DIPSTICK (AUTOMATED)
BILIRUBIN UA: NEGATIVE
GLUCOSE UA: NEGATIVE
KETONES UA: NEGATIVE
Leukocytes, UA: NEGATIVE
Nitrite, UA: NEGATIVE
Protein, UA: NEGATIVE
SPEC GRAV UA: 1.015
UROBILINOGEN UA: NEGATIVE
pH, UA: 6.5

## 2016-04-16 MED ORDER — OXYCODONE-ACETAMINOPHEN 10-325 MG PO TABS: 1.0000 | ORAL_TABLET | ORAL | 0 refills | Status: DC | PRN

## 2016-04-16 NOTE — Progress Notes (Signed)
Subjective:    Patient ID: Crystal Blake, female    DOB: Sep 02, 1955, 61 y.o.   MRN: [id number]  HPI Her husband called yesterday and said she was having some back pain issues. She was given an appointment for today. When she arrived today, she had other concerns as well. She says that amlodipine prescribed for hypertension caused lower extremity edema and she quit taking it. She had onset of left back and left lower quadrant abdominal pain a couple of days ago. Has been somewhat constipated but has been taking some leftover hydrocodone/APAP tablets from foot surgery for the pain. She has a long-standing history of recurrent low back pain. She has no UTI symptoms. She felt that there was some abdominal bloating and became alarmed that she might have an abdominal tumor. Her urine specimen that was abnormal today and shows occult blood. It was sent for culture and microscopic evaluation.  She tells me that she's having considerable issues with anxiety. She is recently seen her psychiatrist, Dr. Toy Care. She has significant situational stress. Her youngest daughter is living in [place], having issues with roommates and apparently attempted to kill herself recently. Patient plans to go to [place] early next week to check on her and find her a different living environment. Her husband completed a real estate course and is trying to start his own real estate business. He currently has no income. She does not work outside the home either. This has created some financial stress and tax issues. She is quite worried.  She has had an outbreak of psoriasis due to situational stress.  There are some issues with neighbors who will not cut or remove tall trees adjacent to her home.  Oldest daughter is living in [place] as well living in a different location from her sister , attending college and doing very well.    Review of Systems no frank dysuria or significant urinary frequency. No diarrhea. No vomiting.  Pain now more in the left lower quadrant rather than the left back     Objective:   Physical Exam Abdomen appears slightly bloated. No hepatosplenomegaly or masses appreciated. Straight leg raising is negative at 90 on the left. Muscle strength in the left lower extremity is normal. There is no rebound tenderness in the left lower quadrant and no significant tenderness to palpation.       Assessment & Plan:  With hematuria and left lower quadrant pain,  I am  concerned she may have a kidney stone. She is to have a CT tomorrow for further evaluation. With regard to hypertension, pressure is elevated today of believe due to stress and pain. I gave her a small quantity of Percocet sufficient for 5 days of therapy. We have new opioid laws in [place] and cannot give more than 5 days of narcotics for acute pain. She'll need to follow-up with regard to elevated blood pressure and management in a couple of weeks. She will continue to see Dr. Toy Care for anxiety and depression. Suggested she see dermatologist for psoriasis which is affecting multiple areas including her right elbow, as well as her scalp.  With regard to situational stress with taxes, she is hopeful there may be some flexibility with the IRS given that she and her husband presently have no income. I would she would be given some consideration in that regard. She is clearly under duress today.  Addendum: April 17 2016-CT shows a 4 x 6 mm kidney stone in the  proximal left ureter. Generally small kidney stones can be passed over a period of a few weeks. This one is borderline in size for being able to be passed without intervention. She will need to see urologist. Urine culture is negative. There is no evidence of abdominal tumor. She does have diverticulosis. She has fatty liver as well. These to manage constipation while narcotic pain medication for kidney stone.

## 2016-04-17 ENCOUNTER — Other Ambulatory Visit: Payer: Self-pay | Admitting: Internal Medicine

## 2016-04-17 ENCOUNTER — Ambulatory Visit
Admission: RE | Admit: 2016-04-17 | Discharge: 2016-04-17 | Disposition: A | Discharge status: Home / Self Care | Payer: BLUE CROSS/BLUE SHIELD | Source: Ambulatory Visit | Attending: Internal Medicine | Admitting: Internal Medicine

## 2016-04-17 DIAGNOSIS — N132 Hydronephrosis with renal and ureteral calculous obstruction: Secondary | ICD-10-CM | POA: Diagnosis not present

## 2016-04-17 LAB — URINALYSIS, MICROSCOPIC ONLY
Bacteria, UA: NONE SEEN [HPF]
CASTS: NONE SEEN [LPF]
CRYSTALS: NONE SEEN [HPF]
SQUAMOUS EPITHELIAL / LPF: NONE SEEN [HPF] (ref <=5)
Yeast: NONE SEEN [HPF]

## 2016-04-17 LAB — URINE CULTURE: Organism ID, Bacteria: NO GROWTH

## 2016-04-17 NOTE — Patient Instructions (Signed)
Manage constipation while you're taking narcotic medication for kidney stone and back pain with over-the-counter laxatives and drinking plenty of fluids. We will make appointment for you to see urologist in the near future. Please see dermatologist regarding psoriasis. Continue to see Dr. Toy Care. Recommend straining your urine and collecting stone if possible.  Follow-up regarding hypertension and lower extremity edema secondary to amlodipine in a couple of weeks.

## 2016-04-19 DIAGNOSIS — R351 Nocturia: Secondary | ICD-10-CM | POA: Diagnosis not present

## 2016-04-19 DIAGNOSIS — N202 Calculus of kidney with calculus of ureter: Secondary | ICD-10-CM | POA: Diagnosis not present

## 2016-04-19 DIAGNOSIS — R8271 Bacteriuria: Secondary | ICD-10-CM | POA: Diagnosis not present

## 2016-05-02 ENCOUNTER — Encounter: Payer: Self-pay | Admitting: Internal Medicine

## 2016-05-02 ENCOUNTER — Ambulatory Visit (INDEPENDENT_AMBULATORY_CARE_PROVIDER_SITE_OTHER): Payer: BLUE CROSS/BLUE SHIELD | Admitting: Internal Medicine

## 2016-05-02 VITALS — BP 144/90 | HR 89 | Temp 99.0°F | Wt 175.0 lb

## 2016-05-02 DIAGNOSIS — I10 Essential (primary) hypertension: Secondary | ICD-10-CM | POA: Diagnosis not present

## 2016-05-02 DIAGNOSIS — L409 Psoriasis, unspecified: Secondary | ICD-10-CM

## 2016-05-02 DIAGNOSIS — F439 Reaction to severe stress, unspecified: Secondary | ICD-10-CM

## 2016-05-02 DIAGNOSIS — H6011 Cellulitis of right external ear: Secondary | ICD-10-CM

## 2016-05-02 DIAGNOSIS — N200 Calculus of kidney: Secondary | ICD-10-CM | POA: Diagnosis not present

## 2016-05-02 MED ORDER — DOXYCYCLINE HYCLATE 100 MG PO TABS: 100.0000 mg | ORAL_TABLET | Freq: Two times a day (BID) | ORAL | 0 refills | Status: DC

## 2016-05-02 MED ORDER — PREDNISONE 10 MG PO TABS: ORAL_TABLET | ORAL | 0 refills | Status: DC

## 2016-05-02 NOTE — Progress Notes (Signed)
   Subjective:    Patient ID: Crystal Blake, female    DOB: 1955/08/02, 61 y.o.   MRN: [id number]  HPI 61 year old Female back from [place] where she was visiting her daughter. Still has a lot of situational stress. Has had extreme exacerbation of psoriasis affecting multiple body parts but has now developed right ear discomfort. Has psoriasis affecting her right ear and is worried she may have a secondary bacterial infection. No drainage from the ear but ears feel clogged up.  Has yet to see Dermatologist. Saw Dr. Tonia Brooms a long time ago. Dr. Tonia Brooms has retired but she may want to see was someone in that office.    Review of Systems complaining of plans being swollen and right ear pain. No fever or chills.  Has multiple blood pressure readings on iPhone which are fairly acceptable.  She is in the process of passing a kidney stone is on Flomax per urologist.     Objective:   Physical Exam She has a cellulitis of her right external ear. There is debris in both TMs. Multiple psoriatic lesions on elbow face scalp and ears       Assessment & Plan:  Cellulitis right ear  Plan: At one point she thought she had a rash on Levaquin but it does not sound like a drug reaction to me. Were going to remove Levaquin from the list of allergies. However today I have given her doxycycline 100 mg twice daily for 10 days and Sterapred DS 10 mg 12 day dosepak for psoriasis. She needs to follow-up with dermatologist. With regard to blood pressure management, she has an upcoming physical exam and we can address that at that time.

## 2016-05-02 NOTE — Patient Instructions (Addendum)
Take prednisone as directed and tapering course for 12 days. Doxycycline 100 mg twice daily for 10 days. See dermatologist regarding treatment of psoriasis. Continue to be followed by urologist for kidney stone. Physical exam appointment made for April. Follow-up with blood pressure issues at that time.

## 2016-05-07 DIAGNOSIS — N202 Calculus of kidney with calculus of ureter: Secondary | ICD-10-CM | POA: Diagnosis not present

## 2016-05-07 DIAGNOSIS — R350 Frequency of micturition: Secondary | ICD-10-CM | POA: Diagnosis not present

## 2016-05-13 ENCOUNTER — Other Ambulatory Visit: Payer: Self-pay | Admitting: Internal Medicine

## 2016-06-28 ENCOUNTER — Encounter: Payer: Self-pay | Admitting: Internal Medicine

## 2016-07-02 ENCOUNTER — Other Ambulatory Visit: Payer: BLUE CROSS/BLUE SHIELD | Admitting: Internal Medicine

## 2016-07-04 ENCOUNTER — Encounter: Payer: BLUE CROSS/BLUE SHIELD | Admitting: Internal Medicine

## 2016-07-22 DIAGNOSIS — R351 Nocturia: Secondary | ICD-10-CM | POA: Diagnosis not present

## 2016-07-22 DIAGNOSIS — R350 Frequency of micturition: Secondary | ICD-10-CM | POA: Diagnosis not present

## 2016-07-28 ENCOUNTER — Other Ambulatory Visit: Payer: Self-pay | Admitting: Internal Medicine

## 2016-07-29 NOTE — Telephone Encounter (Signed)
Refill x one year °

## 2016-08-01 ENCOUNTER — Other Ambulatory Visit: Payer: Self-pay | Admitting: Obstetrics and Gynecology

## 2016-08-01 DIAGNOSIS — N760 Acute vaginitis: Secondary | ICD-10-CM | POA: Diagnosis not present

## 2016-08-02 DIAGNOSIS — Z1231 Encounter for screening mammogram for malignant neoplasm of breast: Secondary | ICD-10-CM | POA: Diagnosis not present

## 2016-08-02 DIAGNOSIS — Z6826 Body mass index (BMI) 26.0-26.9, adult: Secondary | ICD-10-CM | POA: Diagnosis not present

## 2016-08-02 DIAGNOSIS — Z01419 Encounter for gynecological examination (general) (routine) without abnormal findings: Secondary | ICD-10-CM | POA: Diagnosis not present

## 2016-08-20 DIAGNOSIS — N201 Calculus of ureter: Secondary | ICD-10-CM | POA: Diagnosis not present

## 2016-08-27 ENCOUNTER — Other Ambulatory Visit: Payer: BLUE CROSS/BLUE SHIELD | Admitting: Internal Medicine

## 2016-08-27 DIAGNOSIS — I10 Essential (primary) hypertension: Secondary | ICD-10-CM

## 2016-08-27 DIAGNOSIS — E785 Hyperlipidemia, unspecified: Secondary | ICD-10-CM | POA: Diagnosis not present

## 2016-08-27 DIAGNOSIS — Z1329 Encounter for screening for other suspected endocrine disorder: Secondary | ICD-10-CM

## 2016-08-27 DIAGNOSIS — E8881 Metabolic syndrome: Secondary | ICD-10-CM | POA: Diagnosis not present

## 2016-08-27 DIAGNOSIS — Z1321 Encounter for screening for nutritional disorder: Secondary | ICD-10-CM

## 2016-08-27 DIAGNOSIS — R5383 Other fatigue: Secondary | ICD-10-CM

## 2016-08-27 DIAGNOSIS — Z0000 Encounter for general adult medical examination without abnormal findings: Secondary | ICD-10-CM

## 2016-08-27 LAB — CBC WITH DIFFERENTIAL/PLATELET
BASOS PCT: 1 %
Basophils Absolute: 96 cells/uL (ref 0–200)
EOS ABS: 288 {cells}/uL (ref 15–500)
EOS PCT: 3 %
HCT: 45.3 % — ABNORMAL HIGH (ref 35.0–45.0)
Hemoglobin: 14.8 g/dL (ref 11.7–15.5)
Lymphocytes Relative: 34 %
Lymphs Abs: 3264 cells/uL (ref 850–3900)
MCH: 28.7 pg (ref 27.0–33.0)
MCHC: 32.7 g/dL (ref 32.0–36.0)
MCV: 87.8 fL (ref 80.0–100.0)
MONOS PCT: 6 %
MPV: 8.6 fL (ref 7.5–12.5)
Monocytes Absolute: 576 cells/uL (ref 200–950)
NEUTROS ABS: 5376 {cells}/uL (ref 1500–7800)
Neutrophils Relative %: 56 %
PLATELETS: 356 10*3/uL (ref 140–400)
RBC: 5.16 MIL/uL — AB (ref 3.80–5.10)
RDW: 14.2 % (ref 11.0–15.0)
WBC: 9.6 10*3/uL (ref 3.8–10.8)

## 2016-08-27 LAB — COMPLETE METABOLIC PANEL WITH GFR
ALT: 36 U/L — AB (ref 6–29)
AST: 28 U/L (ref 10–35)
Albumin: 4.2 g/dL (ref 3.6–5.1)
Alkaline Phosphatase: 59 U/L (ref 33–130)
BILIRUBIN TOTAL: 0.5 mg/dL (ref 0.2–1.2)
BUN: 22 mg/dL (ref 7–25)
CHLORIDE: 102 mmol/L (ref 98–110)
CO2: 28 mmol/L (ref 20–31)
CREATININE: 0.72 mg/dL (ref 0.50–1.05)
Calcium: 9.7 mg/dL (ref 8.6–10.4)
GFR, Est African American: >89 mL/min (ref >=60)
GFR, Est Non African American: >89 mL/min (ref >=60)
GLUCOSE: 92 mg/dL (ref 65–99)
Potassium: 4.7 mmol/L (ref 3.5–5.3)
SODIUM: 139 mmol/L (ref 135–146)
TOTAL PROTEIN: 7.2 g/dL (ref 6.1–8.1)

## 2016-08-27 LAB — LIPID PANEL
Cholesterol: 227 mg/dL — ABNORMAL HIGH (ref <200)
HDL: 43 mg/dL — ABNORMAL LOW (ref >50)
Total CHOL/HDL Ratio: 5.3 Ratio — ABNORMAL HIGH (ref <5.0)
Triglycerides: 608 mg/dL — ABNORMAL HIGH (ref <150)

## 2016-08-28 LAB — TSH: TSH: 2.73 m[IU]/L

## 2016-08-28 LAB — HEMOGLOBIN A1C
HEMOGLOBIN A1C: 5.5 % (ref <5.7)
Mean Plasma Glucose: 111 mg/dL

## 2016-08-28 LAB — VITAMIN D 25 HYDROXY (VIT D DEFICIENCY, FRACTURES): Vit D, 25-Hydroxy: 41 ng/mL (ref 30–100)

## 2016-08-30 ENCOUNTER — Encounter: Payer: Self-pay | Admitting: Internal Medicine

## 2016-08-30 ENCOUNTER — Ambulatory Visit (INDEPENDENT_AMBULATORY_CARE_PROVIDER_SITE_OTHER): Payer: BLUE CROSS/BLUE SHIELD | Admitting: Internal Medicine

## 2016-08-30 VITALS — BP 126/84 | HR 87 | Temp 99.2°F | Ht 66.25 in | Wt 173.0 lb

## 2016-08-30 DIAGNOSIS — M5441 Lumbago with sciatica, right side: Secondary | ICD-10-CM | POA: Diagnosis not present

## 2016-08-30 DIAGNOSIS — R7302 Impaired glucose tolerance (oral): Secondary | ICD-10-CM

## 2016-08-30 DIAGNOSIS — N200 Calculus of kidney: Secondary | ICD-10-CM

## 2016-08-30 DIAGNOSIS — E782 Mixed hyperlipidemia: Secondary | ICD-10-CM

## 2016-08-30 DIAGNOSIS — F419 Anxiety disorder, unspecified: Secondary | ICD-10-CM | POA: Diagnosis not present

## 2016-08-30 DIAGNOSIS — F439 Reaction to severe stress, unspecified: Secondary | ICD-10-CM | POA: Diagnosis not present

## 2016-08-30 DIAGNOSIS — I10 Essential (primary) hypertension: Secondary | ICD-10-CM | POA: Diagnosis not present

## 2016-08-30 DIAGNOSIS — L409 Psoriasis, unspecified: Secondary | ICD-10-CM | POA: Diagnosis not present

## 2016-08-30 DIAGNOSIS — E8881 Metabolic syndrome: Secondary | ICD-10-CM | POA: Diagnosis not present

## 2016-08-30 DIAGNOSIS — Z6827 Body mass index (BMI) 27.0-27.9, adult: Secondary | ICD-10-CM | POA: Diagnosis not present

## 2016-08-30 DIAGNOSIS — F32A Depression, unspecified: Secondary | ICD-10-CM

## 2016-08-30 DIAGNOSIS — F329 Major depressive disorder, single episode, unspecified: Secondary | ICD-10-CM

## 2016-08-30 DIAGNOSIS — G8929 Other chronic pain: Secondary | ICD-10-CM

## 2016-08-30 DIAGNOSIS — G2581 Restless legs syndrome: Secondary | ICD-10-CM | POA: Diagnosis not present

## 2016-08-30 DIAGNOSIS — Z0000 Encounter for general adult medical examination without abnormal findings: Secondary | ICD-10-CM | POA: Diagnosis not present

## 2016-08-30 DIAGNOSIS — E119 Type 2 diabetes mellitus without complications: Secondary | ICD-10-CM | POA: Insufficient documentation

## 2016-08-30 DIAGNOSIS — L304 Erythema intertrigo: Secondary | ICD-10-CM

## 2016-08-30 LAB — POCT URINALYSIS DIPSTICK
Bilirubin, UA: NEGATIVE
Glucose, UA: NEGATIVE
Ketones, UA: NEGATIVE
Leukocytes, UA: NEGATIVE
NITRITE UA: NEGATIVE
PH UA: 5 (ref 5.0–8.0)
PROTEIN UA: NEGATIVE
RBC UA: NEGATIVE
SPEC GRAV UA: 1.015 (ref 1.010–1.025)
UROBILINOGEN UA: 0.2 U/dL

## 2016-08-30 MED ORDER — NYSTATIN 100000 UNIT/GM EX POWD: Freq: Three times a day (TID) | CUTANEOUS | 99 refills | Status: DC

## 2016-08-30 MED ORDER — HYDROCORTISONE 2.5 % RE CREA: 1.0000 "application " | TOPICAL_CREAM | Freq: Four times a day (QID) | RECTAL | 99 refills | Status: DC

## 2016-08-30 NOTE — Progress Notes (Signed)
Subjective:    Patient ID: Crystal Blake, female    DOB: 12-17-1955, 61 y.o.   MRN: [id number]  HPI 61 year old  Female for health maintenance exam and evaluation of medical issues.  She has a history of essential hypertension, impaired glucose tolerance, hyperlipidemia, obesity, metabolic syndrome, chronic low back pain, restless leg syndrome, anxiety depression. In 2017 she weighed 177 pounds and now weighs 173 pounds.  She saw GYN physician, Dr. Philis Pique 3 weeks ago.  She has a history of attention deficit disorder has a hard time getting out of bed in the mornings due to fatigue.  Had tetanus immunization 2014  Has been a patient in this practice since 1987.   Status post hysterectomy without oophorectomy.  She had a suicidal gesture at 61 years of age. She is a former smoker and quit in the late 1980s. History of migraine headaches but not recently. History of allergic rhinitis. Had a benign cyst removed from her left upper arm in 1972. Abortion 1979. Severe depression 1980.  Social history: She is married. She has 2 daughters both of whom are now living in [place]. One daughter is attending [employer] and the other one is a Equities trader. She has a college degree from [employer] in [place]. 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  Family history: Father died with liver cancer. Mother deceased as well with history of hypertension, hyperlipidemia and alcoholism.  Recently seen for left lower quadrant flank pain and found to have a 6 x 4 mm proximal left ureteral stone with mild hydronephrosis. Also on CT was noted to have L4-L5 severe facet arthropathy with anteriolisthesis and spinal stenosis. Moderate colonic diverticulosis noted.  Had mammogram through GYN office.     Review of Systems  Respiratory: Negative.   Cardiovascular: Negative.     Gastrointestinal: Negative.   Genitourinary: Negative.   Neurological: Negative.    chronic recurrent low back pain for which she has been to physical therapy     Objective:   Physical Exam  Constitutional: She is oriented to person, place, and time. She appears well-developed and well-nourished. No distress.  HENT:  Head: Atraumatic.  Right Ear: External ear normal.  Left Ear: External ear normal.  Mouth/Throat: Oropharynx is clear and moist.  Eyes: Conjunctivae are normal. Pupils are equal, round, and reactive to light. Right eye exhibits no discharge. Left eye exhibits no discharge. No scleral icterus.  Neck: Neck supple. No JVD present. No thyromegaly present.  Cardiovascular: Normal rate, regular rhythm, normal heart sounds and intact distal pulses.   No murmur heard. Pulmonary/Chest: Effort normal and breath sounds normal. No respiratory distress. She has no wheezes.  Normal female breast exam  Abdominal: Soft. Bowel sounds are normal. She exhibits no distension. There is no tenderness. There is no rebound and no guarding.  Genitourinary:  Genitourinary Comments: Deferred to GYN. She is status post hysterectomy without oophorectomy  Musculoskeletal: She exhibits no edema.  Lymphadenopathy:    She has no cervical adenopathy.  Neurological: She is alert and oriented to person, place, and time. She has normal reflexes.  Skin: Skin is warm and dry. No rash noted. She is not diaphoretic.  Psychiatric: She has a normal mood and affect. Her behavior is normal. Judgment and thought content normal.  Vitals reviewed.         Assessment &  Plan:  Impaired glucose tolerance-her hemoglobin A1c is currently normal  Hyperlipidemia-is not been taking statin therapy and total cholesterol is 227 with triglycerides of 608. This is concerning. She needs to get back on statin medication and follow-up with lipid panel to be arranged  History of anxiety depression  Attention deficit  disorder-treated by Dr. Toy Care  Restless leg syndrome  Recurrent back pain with noted lumbar spinal stenosis and severe facet arthropathy and anteriolisthesis  Essential hypertension-stable  Colonoscopy done in 2017 with tubular and adenomatous polyps noted  Plan: She needs follow-up lipid panel. Appointment made in July for lipid panel.

## 2016-08-31 LAB — MICROALBUMIN / CREATININE URINE RATIO
Creatinine, Urine: 119 mg/dL (ref 20–320)
Microalb Creat Ratio: 8 mcg/mg creat (ref <30)
Microalb, Ur: 0.9 mg/dL

## 2016-09-27 DIAGNOSIS — N201 Calculus of ureter: Secondary | ICD-10-CM | POA: Diagnosis not present

## 2016-10-01 ENCOUNTER — Other Ambulatory Visit: Payer: BLUE CROSS/BLUE SHIELD | Admitting: Internal Medicine

## 2016-10-08 ENCOUNTER — Other Ambulatory Visit: Payer: BLUE CROSS/BLUE SHIELD | Admitting: Internal Medicine

## 2016-10-08 DIAGNOSIS — E785 Hyperlipidemia, unspecified: Secondary | ICD-10-CM

## 2016-10-08 LAB — LIPID PANEL
CHOL/HDL RATIO: 3.8 ratio (ref <5.0)
Cholesterol: 221 mg/dL — ABNORMAL HIGH (ref <200)
HDL: 58 mg/dL (ref >50)
LDL CALC: 131 mg/dL — AB (ref <100)
TRIGLYCERIDES: 162 mg/dL — AB (ref <150)
VLDL: 32 mg/dL — ABNORMAL HIGH (ref <30)

## 2016-10-22 DIAGNOSIS — F900 Attention-deficit hyperactivity disorder, predominantly inattentive type: Secondary | ICD-10-CM | POA: Diagnosis not present

## 2016-10-22 DIAGNOSIS — F410 Panic disorder [episodic paroxysmal anxiety] without agoraphobia: Secondary | ICD-10-CM | POA: Diagnosis not present

## 2016-10-22 DIAGNOSIS — F331 Major depressive disorder, recurrent, moderate: Secondary | ICD-10-CM | POA: Diagnosis not present

## 2016-11-22 DIAGNOSIS — H2513 Age-related nuclear cataract, bilateral: Secondary | ICD-10-CM | POA: Diagnosis not present

## 2016-11-22 DIAGNOSIS — H5211 Myopia, right eye: Secondary | ICD-10-CM | POA: Diagnosis not present

## 2016-11-22 DIAGNOSIS — H5202 Hypermetropia, left eye: Secondary | ICD-10-CM | POA: Diagnosis not present

## 2016-11-28 ENCOUNTER — Other Ambulatory Visit: Payer: Self-pay | Admitting: Internal Medicine

## 2016-12-06 DIAGNOSIS — H10413 Chronic giant papillary conjunctivitis, bilateral: Secondary | ICD-10-CM | POA: Diagnosis not present

## 2016-12-20 DIAGNOSIS — H10413 Chronic giant papillary conjunctivitis, bilateral: Secondary | ICD-10-CM | POA: Diagnosis not present

## 2017-01-03 DIAGNOSIS — H10413 Chronic giant papillary conjunctivitis, bilateral: Secondary | ICD-10-CM | POA: Diagnosis not present

## 2017-01-21 DIAGNOSIS — H10413 Chronic giant papillary conjunctivitis, bilateral: Secondary | ICD-10-CM | POA: Diagnosis not present

## 2017-02-18 DIAGNOSIS — H10413 Chronic giant papillary conjunctivitis, bilateral: Secondary | ICD-10-CM | POA: Diagnosis not present

## 2017-02-28 DIAGNOSIS — F410 Panic disorder [episodic paroxysmal anxiety] without agoraphobia: Secondary | ICD-10-CM | POA: Diagnosis not present

## 2017-02-28 DIAGNOSIS — F900 Attention-deficit hyperactivity disorder, predominantly inattentive type: Secondary | ICD-10-CM | POA: Diagnosis not present

## 2017-02-28 DIAGNOSIS — F3342 Major depressive disorder, recurrent, in full remission: Secondary | ICD-10-CM | POA: Diagnosis not present

## 2017-03-21 DIAGNOSIS — N200 Calculus of kidney: Secondary | ICD-10-CM | POA: Diagnosis not present

## 2017-04-01 DIAGNOSIS — H10413 Chronic giant papillary conjunctivitis, bilateral: Secondary | ICD-10-CM | POA: Diagnosis not present

## 2017-04-03 ENCOUNTER — Other Ambulatory Visit: Payer: Self-pay | Admitting: Internal Medicine

## 2017-05-30 ENCOUNTER — Other Ambulatory Visit: Payer: Self-pay | Admitting: Internal Medicine

## 2017-07-03 ENCOUNTER — Other Ambulatory Visit: Payer: Self-pay

## 2017-07-03 DIAGNOSIS — G2581 Restless legs syndrome: Secondary | ICD-10-CM

## 2017-07-03 MED ORDER — ROPINIROLE HCL 1 MG PO TABS: 1.0000 mg | ORAL_TABLET | Freq: Every day | ORAL | 3 refills | Status: DC

## 2017-07-24 DIAGNOSIS — F3342 Major depressive disorder, recurrent, in full remission: Secondary | ICD-10-CM | POA: Diagnosis not present

## 2017-07-24 DIAGNOSIS — F900 Attention-deficit hyperactivity disorder, predominantly inattentive type: Secondary | ICD-10-CM | POA: Diagnosis not present

## 2017-07-31 DIAGNOSIS — H04123 Dry eye syndrome of bilateral lacrimal glands: Secondary | ICD-10-CM | POA: Diagnosis not present

## 2017-10-08 ENCOUNTER — Encounter: Payer: Self-pay | Admitting: Internal Medicine

## 2017-10-13 ENCOUNTER — Other Ambulatory Visit: Payer: Self-pay | Admitting: Internal Medicine

## 2017-11-27 ENCOUNTER — Other Ambulatory Visit: Payer: Self-pay | Admitting: Internal Medicine

## 2017-12-04 DIAGNOSIS — H10413 Chronic giant papillary conjunctivitis, bilateral: Secondary | ICD-10-CM | POA: Diagnosis not present

## 2017-12-08 ENCOUNTER — Other Ambulatory Visit: Payer: Self-pay | Admitting: Internal Medicine

## 2017-12-08 DIAGNOSIS — F439 Reaction to severe stress, unspecified: Secondary | ICD-10-CM

## 2017-12-08 DIAGNOSIS — E782 Mixed hyperlipidemia: Secondary | ICD-10-CM

## 2017-12-08 DIAGNOSIS — F32A Depression, unspecified: Secondary | ICD-10-CM

## 2017-12-08 DIAGNOSIS — G2581 Restless legs syndrome: Secondary | ICD-10-CM

## 2017-12-08 DIAGNOSIS — Z0000 Encounter for general adult medical examination without abnormal findings: Secondary | ICD-10-CM

## 2017-12-08 DIAGNOSIS — I10 Essential (primary) hypertension: Secondary | ICD-10-CM

## 2017-12-08 DIAGNOSIS — G8929 Other chronic pain: Secondary | ICD-10-CM

## 2017-12-08 DIAGNOSIS — L409 Psoriasis, unspecified: Secondary | ICD-10-CM

## 2017-12-08 DIAGNOSIS — E119 Type 2 diabetes mellitus without complications: Secondary | ICD-10-CM

## 2017-12-08 DIAGNOSIS — R7302 Impaired glucose tolerance (oral): Secondary | ICD-10-CM

## 2017-12-08 DIAGNOSIS — F419 Anxiety disorder, unspecified: Secondary | ICD-10-CM

## 2017-12-08 DIAGNOSIS — Z8639 Personal history of other endocrine, nutritional and metabolic disease: Secondary | ICD-10-CM

## 2017-12-08 DIAGNOSIS — F329 Major depressive disorder, single episode, unspecified: Secondary | ICD-10-CM

## 2017-12-08 DIAGNOSIS — M5441 Lumbago with sciatica, right side: Secondary | ICD-10-CM

## 2017-12-09 ENCOUNTER — Other Ambulatory Visit: Payer: BLUE CROSS/BLUE SHIELD | Admitting: Internal Medicine

## 2017-12-09 DIAGNOSIS — E119 Type 2 diabetes mellitus without complications: Secondary | ICD-10-CM

## 2017-12-09 DIAGNOSIS — Z1231 Encounter for screening mammogram for malignant neoplasm of breast: Secondary | ICD-10-CM | POA: Diagnosis not present

## 2017-12-09 DIAGNOSIS — F32A Depression, unspecified: Secondary | ICD-10-CM

## 2017-12-09 DIAGNOSIS — F900 Attention-deficit hyperactivity disorder, predominantly inattentive type: Secondary | ICD-10-CM | POA: Diagnosis not present

## 2017-12-09 DIAGNOSIS — M5441 Lumbago with sciatica, right side: Secondary | ICD-10-CM

## 2017-12-09 DIAGNOSIS — I10 Essential (primary) hypertension: Secondary | ICD-10-CM

## 2017-12-09 DIAGNOSIS — Z0000 Encounter for general adult medical examination without abnormal findings: Secondary | ICD-10-CM

## 2017-12-09 DIAGNOSIS — Z8639 Personal history of other endocrine, nutritional and metabolic disease: Secondary | ICD-10-CM

## 2017-12-09 DIAGNOSIS — E782 Mixed hyperlipidemia: Secondary | ICD-10-CM | POA: Diagnosis not present

## 2017-12-09 DIAGNOSIS — Z6829 Body mass index (BMI) 29.0-29.9, adult: Secondary | ICD-10-CM | POA: Diagnosis not present

## 2017-12-09 DIAGNOSIS — F329 Major depressive disorder, single episode, unspecified: Secondary | ICD-10-CM

## 2017-12-09 DIAGNOSIS — L409 Psoriasis, unspecified: Secondary | ICD-10-CM | POA: Diagnosis not present

## 2017-12-09 DIAGNOSIS — F419 Anxiety disorder, unspecified: Secondary | ICD-10-CM

## 2017-12-09 DIAGNOSIS — F439 Reaction to severe stress, unspecified: Secondary | ICD-10-CM

## 2017-12-09 DIAGNOSIS — R7302 Impaired glucose tolerance (oral): Secondary | ICD-10-CM | POA: Diagnosis not present

## 2017-12-09 DIAGNOSIS — Z01419 Encounter for gynecological examination (general) (routine) without abnormal findings: Secondary | ICD-10-CM | POA: Diagnosis not present

## 2017-12-09 DIAGNOSIS — G8929 Other chronic pain: Secondary | ICD-10-CM

## 2017-12-09 DIAGNOSIS — E8881 Metabolic syndrome: Secondary | ICD-10-CM | POA: Diagnosis not present

## 2017-12-09 DIAGNOSIS — G2581 Restless legs syndrome: Secondary | ICD-10-CM

## 2017-12-09 DIAGNOSIS — Z0142 Encounter for cervical smear to confirm findings of recent normal smear following initial abnormal smear: Secondary | ICD-10-CM | POA: Diagnosis not present

## 2017-12-10 LAB — COMPLETE METABOLIC PANEL WITH GFR
AG Ratio: 2 (calc) (ref 1.0–2.5)
ALBUMIN MSPROF: 4.8 g/dL (ref 3.6–5.1)
ALKALINE PHOSPHATASE (APISO): 71 U/L (ref 33–130)
ALT: 37 U/L — ABNORMAL HIGH (ref 6–29)
AST: 31 U/L (ref 10–35)
BILIRUBIN TOTAL: 0.7 mg/dL (ref 0.2–1.2)
BUN: 18 mg/dL (ref 7–25)
CHLORIDE: 102 mmol/L (ref 98–110)
CO2: 26 mmol/L (ref 20–32)
Calcium: 10.4 mg/dL (ref 8.6–10.4)
Creat: 0.8 mg/dL (ref 0.50–0.99)
GFR, Est African American: 93 mL/min/{1.73_m2} (ref >=60)
GFR, Est Non African American: 80 mL/min/{1.73_m2} (ref >=60)
GLUCOSE: 108 mg/dL — AB (ref 65–99)
Globulin: 2.4 g/dL (calc) (ref 1.9–3.7)
Potassium: 4.7 mmol/L (ref 3.5–5.3)
Sodium: 140 mmol/L (ref 135–146)
Total Protein: 7.2 g/dL (ref 6.1–8.1)

## 2017-12-10 LAB — TSH: TSH: 3.56 m[IU]/L (ref 0.40–4.50)

## 2017-12-10 LAB — CBC WITH DIFFERENTIAL/PLATELET
BASOS PCT: 1.1 %
Basophils Absolute: 81 cells/uL (ref 0–200)
EOS PCT: 2.7 %
Eosinophils Absolute: 200 cells/uL (ref 15–500)
HCT: 46.3 % — ABNORMAL HIGH (ref 35.0–45.0)
Hemoglobin: 15.3 g/dL (ref 11.7–15.5)
LYMPHS ABS: 2612 {cells}/uL (ref 850–3900)
MCH: 28.7 pg (ref 27.0–33.0)
MCHC: 33 g/dL (ref 32.0–36.0)
MCV: 86.9 fL (ref 80.0–100.0)
MPV: 9.1 fL (ref 7.5–12.5)
Monocytes Relative: 8.8 %
Neutro Abs: 3855 cells/uL (ref 1500–7800)
Neutrophils Relative %: 52.1 %
PLATELETS: 333 10*3/uL (ref 140–400)
RBC: 5.33 10*6/uL — AB (ref 3.80–5.10)
RDW: 13.1 % (ref 11.0–15.0)
TOTAL LYMPHOCYTE: 35.3 %
WBC mixed population: 651 cells/uL (ref 200–950)
WBC: 7.4 10*3/uL (ref 3.8–10.8)

## 2017-12-10 LAB — MICROALBUMIN / CREATININE URINE RATIO
CREATININE, URINE: 200 mg/dL (ref 20–275)
MICROALB UR: 1.2 mg/dL
MICROALB/CREAT RATIO: 6 ug/mg{creat} (ref <30)

## 2017-12-10 LAB — LIPID PANEL
CHOL/HDL RATIO: 4.1 (calc) (ref <5.0)
Cholesterol: 212 mg/dL — ABNORMAL HIGH (ref <200)
HDL: 52 mg/dL (ref >50)
LDL CHOLESTEROL (CALC): 104 mg/dL — AB
NON-HDL CHOLESTEROL (CALC): 160 mg/dL — AB (ref <130)
TRIGLYCERIDES: 391 mg/dL — AB (ref <150)

## 2017-12-10 LAB — HEMOGLOBIN A1C
HEMOGLOBIN A1C: 5.7 %{Hb} — AB (ref <5.7)
MEAN PLASMA GLUCOSE: 117 (calc)
eAG (mmol/L): 6.5 (calc)

## 2017-12-10 LAB — VITAMIN D 25 HYDROXY (VIT D DEFICIENCY, FRACTURES): Vit D, 25-Hydroxy: 27 ng/mL — ABNORMAL LOW (ref 30–100)

## 2017-12-11 ENCOUNTER — Ambulatory Visit (INDEPENDENT_AMBULATORY_CARE_PROVIDER_SITE_OTHER): Payer: BLUE CROSS/BLUE SHIELD | Admitting: Internal Medicine

## 2017-12-11 ENCOUNTER — Encounter: Payer: Self-pay | Admitting: Internal Medicine

## 2017-12-11 VITALS — BP 130/100 | HR 85 | Ht 66.25 in | Wt 186.0 lb

## 2017-12-11 DIAGNOSIS — E782 Mixed hyperlipidemia: Secondary | ICD-10-CM

## 2017-12-11 DIAGNOSIS — R7302 Impaired glucose tolerance (oral): Secondary | ICD-10-CM

## 2017-12-11 DIAGNOSIS — Z23 Encounter for immunization: Secondary | ICD-10-CM

## 2017-12-11 DIAGNOSIS — Z6829 Body mass index (BMI) 29.0-29.9, adult: Secondary | ICD-10-CM

## 2017-12-11 DIAGNOSIS — Z0000 Encounter for general adult medical examination without abnormal findings: Secondary | ICD-10-CM | POA: Diagnosis not present

## 2017-12-11 DIAGNOSIS — F329 Major depressive disorder, single episode, unspecified: Secondary | ICD-10-CM

## 2017-12-11 DIAGNOSIS — F439 Reaction to severe stress, unspecified: Secondary | ICD-10-CM

## 2017-12-11 DIAGNOSIS — F32A Depression, unspecified: Secondary | ICD-10-CM

## 2017-12-11 DIAGNOSIS — G2581 Restless legs syndrome: Secondary | ICD-10-CM

## 2017-12-11 DIAGNOSIS — L409 Psoriasis, unspecified: Secondary | ICD-10-CM

## 2017-12-11 DIAGNOSIS — E8881 Metabolic syndrome: Secondary | ICD-10-CM

## 2017-12-11 DIAGNOSIS — F900 Attention-deficit hyperactivity disorder, predominantly inattentive type: Secondary | ICD-10-CM

## 2017-12-11 DIAGNOSIS — F419 Anxiety disorder, unspecified: Secondary | ICD-10-CM

## 2017-12-11 DIAGNOSIS — I10 Essential (primary) hypertension: Secondary | ICD-10-CM

## 2017-12-11 LAB — POCT URINALYSIS DIPSTICK
Appearance: NEGATIVE
BILIRUBIN UA: NEGATIVE
Blood, UA: NEGATIVE
GLUCOSE UA: NEGATIVE
KETONES UA: NEGATIVE
Leukocytes, UA: NEGATIVE
Nitrite, UA: NEGATIVE
Odor: NEGATIVE
Protein, UA: NEGATIVE
SPEC GRAV UA: 1.01 (ref 1.010–1.025)
Urobilinogen, UA: 0.2 E.U./dL
pH, UA: 6 (ref 5.0–8.0)

## 2017-12-11 MED ORDER — TRIAMCINOLONE ACETONIDE 0.1 % EX CREA: 1.0000 "application " | TOPICAL_CREAM | Freq: Two times a day (BID) | CUTANEOUS | 0 refills | Status: DC

## 2017-12-11 MED ORDER — PREDNISONE 10 MG PO TABS: ORAL_TABLET | ORAL | 0 refills | Status: DC

## 2017-12-11 NOTE — Progress Notes (Signed)
Subjective:    Patient ID: Crystal Blake, female    DOB: [birth year], 62 y.o.   MRN: [id number]  HPI 62 year old female in today for health maintenance exam and evaluation of medical issues.  Unfortunately, she is having significant situational stress.  There is a property dispute with her neighbors.  Her husband is staying at home a lot because he does not really have much work in the real estate business.  There is financial stress.  She has become frustrated and depressed.  Her psoriasis is acting up quite a bit.  She has a history of attention deficit disorder.  Has a hard time getting out of bed in the mornings due to fatigue.  This is been ongoing for years.  She has a history of essential hypertension, impaired glucose tolerance, hyperlipidemia, obesity, metabolic syndrome, chronic low back pain, restless leg syndrome, anxiety and depression.  She is a patient in this practice since 1987.  Status post hysterectomy without oophorectomy.  She is a former smoker quit in the late 1980s.  History of migraine headaches but not recently.  History of allergic rhinitis.  Had a benign cyst removed from her left upper arm in 1972.  Abortion in 1979.  Severe depression 1980.  Suicidal gesture 62 years of age.  Social history: She is married.  She has 2 daughters both of whom are living in [place].  She has a college degree from [employer] in [place].  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  She also has worked as an Management consultant.  Currently not working.  A few months ago, she completed a Radio broadcast assistant program in [employer].  Her husband has completed real estate training but has found the  real estate business to be a difficult environment.  He formerly worked in  [employer] and also in Camera operator.He was quite successful at both of these.  Family history: Father died with liver cancer.  Mother deceased  as well with history of hypertension, hyperlipidemia and alcoholism.    Review of Systems  Constitutional: Positive for fatigue.  Respiratory: Negative.   Cardiovascular: Negative.   Gastrointestinal: Negative.   Genitourinary: Negative.   Neurological: Negative.   Psychiatric/Behavioral: Positive for dysphoric mood.   Psoriasis not well controlled    Objective:   Physical Exam  Constitutional: She is oriented to person, place, and time. She appears well-developed and well-nourished. No distress.  HENT:  Head: Normocephalic.  Right Ear: External ear normal.  Left Ear: External ear normal.  Mouth/Throat: Oropharynx is clear and moist. No oropharyngeal exudate.  Eyes: Pupils are equal, round, and reactive to light. Conjunctivae are normal. Right eye exhibits no discharge. Left eye exhibits no discharge.  Neck: Neck supple. No JVD present. No thyromegaly present.  Cardiovascular: Normal rate, regular rhythm and normal heart sounds.  No murmur heard. Pulmonary/Chest: Effort normal and breath sounds normal. No stridor. No respiratory distress. She has no wheezes.  Abdominal: Soft. Bowel sounds are normal. She exhibits no distension and no mass. There is no tenderness. There is no rebound and no guarding.  Genitourinary:  Genitourinary Comments: S?P hysterectomy. Bimanual normal  Musculoskeletal: She exhibits no edema.  Lymphadenopathy:    She has no cervical adenopathy.  Neurological: She is alert and oriented to person, place, and time. She displays normal reflexes. No cranial nerve deficit. Coordination normal.  Skin: Skin is warm and dry. She is not  diaphoretic.  Multiple areas of psoriasis on extremities and trunk  Psychiatric: Judgment and thought content normal.  Dysphoric  Vitals reviewed.         Assessment & Plan:  Situational stress  Dysphoric mood-is on SSRI  Psoriasis not well controlled-given tapering course of prednisone over 6 days.  Has clobetasol.   Patient request Kenalog cream which was provided.  Essential hypertension  Hyperlipidemia-treated with Lipitor.  Triglycerides have increased from 1 62-3 91.  Not exercising.  Not watching diet.  Restless leg syndrome  Impaired glucose tolerance  Vitamin D deficiency-take 2000 units vitamin D3 daily  Anxiety depression-has taken Xanax in the past and is on SSRI.  Need to get updated med list from pharmacy.  Patient mentions Dr. Toy Care may be prescribing.  TSH stable at 3.56  Attention deficit disorder-treated with Adderall in the past.  Currently we are not prescribing this  Plan: Flu vaccine given.  Advise counseling regarding situational stress with finances and employment.  She has impaired glucose tolerance with hemoglobin A1c 5.7%.  She is not exercising.  Follow-up in 6 months.  Try to get back on track with diet exercise and weight loss.

## 2017-12-21 ENCOUNTER — Encounter: Payer: Self-pay | Admitting: Internal Medicine

## 2017-12-21 NOTE — Patient Instructions (Signed)
Please consider counseling for situational stress and depression.  Please try to work on diet exercise and weight loss and follow-up in 6 months.  Use psoriasis creams as previously prescribed and take tapering course of prednisone over 6 days to help bring psoriasis under control.

## 2018-01-01 ENCOUNTER — Telehealth: Payer: Self-pay | Admitting: Internal Medicine

## 2018-01-01 NOTE — Telephone Encounter (Signed)
Crystal Blake said he will have his wife call back to schedule an appt.
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
## 2018-01-01 NOTE — Telephone Encounter (Signed)
Needs OV.  

## 2018-01-02 ENCOUNTER — Ambulatory Visit: Payer: BLUE CROSS/BLUE SHIELD | Admitting: Internal Medicine

## 2018-01-02 ENCOUNTER — Encounter: Payer: Self-pay | Admitting: Internal Medicine

## 2018-01-02 VITALS — BP 120/100 | HR 83 | Temp 98.2°F | Ht 66.25 in | Wt 186.0 lb

## 2018-01-02 DIAGNOSIS — M545 Low back pain, unspecified: Secondary | ICD-10-CM

## 2018-01-02 MED ORDER — OXYCODONE-ACETAMINOPHEN 10-325 MG PO TABS: 1.0000 | ORAL_TABLET | Freq: Four times a day (QID) | ORAL | 0 refills | Status: DC | PRN

## 2018-01-02 NOTE — Patient Instructions (Signed)
Try to rest and avoid heavy exertion if possible.  May apply heat or ice to back whichever helps.  Small prescription for oxycodone APAP 10/325 to take sparingly every 6 hours as needed for back pain.
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
## 2018-01-06 ENCOUNTER — Ambulatory Visit: Payer: BLUE CROSS/BLUE SHIELD | Admitting: Internal Medicine

## 2018-01-15 DIAGNOSIS — F410 Panic disorder [episodic paroxysmal anxiety] without agoraphobia: Secondary | ICD-10-CM | POA: Diagnosis not present

## 2018-01-15 DIAGNOSIS — F900 Attention-deficit hyperactivity disorder, predominantly inattentive type: Secondary | ICD-10-CM | POA: Diagnosis not present

## 2018-01-15 DIAGNOSIS — F331 Major depressive disorder, recurrent, moderate: Secondary | ICD-10-CM | POA: Diagnosis not present

## 2018-01-31 ENCOUNTER — Other Ambulatory Visit: Payer: Self-pay | Admitting: Internal Medicine

## 2018-02-21 ENCOUNTER — Other Ambulatory Visit: Payer: Self-pay | Admitting: Internal Medicine

## 2018-03-12 ENCOUNTER — Other Ambulatory Visit: Payer: BLUE CROSS/BLUE SHIELD | Admitting: Internal Medicine

## 2018-03-12 DIAGNOSIS — I10 Essential (primary) hypertension: Secondary | ICD-10-CM | POA: Diagnosis not present

## 2018-03-12 DIAGNOSIS — E782 Mixed hyperlipidemia: Secondary | ICD-10-CM

## 2018-03-12 DIAGNOSIS — Z801 Family history of malignant neoplasm of trachea, bronchus and lung: Secondary | ICD-10-CM | POA: Diagnosis not present

## 2018-03-12 DIAGNOSIS — Z800 Family history of malignant neoplasm of digestive organs: Secondary | ICD-10-CM | POA: Diagnosis not present

## 2018-03-12 DIAGNOSIS — Z8041 Family history of malignant neoplasm of ovary: Secondary | ICD-10-CM | POA: Diagnosis not present

## 2018-03-12 DIAGNOSIS — Z803 Family history of malignant neoplasm of breast: Secondary | ICD-10-CM | POA: Diagnosis not present

## 2018-03-12 LAB — HEPATIC FUNCTION PANEL
AG RATIO: 1.8 (calc) (ref 1.0–2.5)
ALT: 35 U/L — ABNORMAL HIGH (ref 6–29)
AST: 25 U/L (ref 10–35)
Albumin: 4.6 g/dL (ref 3.6–5.1)
Alkaline phosphatase (APISO): 80 U/L (ref 33–130)
BILIRUBIN INDIRECT: 0.7 mg/dL (ref 0.2–1.2)
BILIRUBIN TOTAL: 0.8 mg/dL (ref 0.2–1.2)
Bilirubin, Direct: 0.1 mg/dL (ref 0.0–0.2)
GLOBULIN: 2.6 g/dL (ref 1.9–3.7)
Total Protein: 7.2 g/dL (ref 6.1–8.1)

## 2018-03-12 LAB — LIPID PANEL
CHOLESTEROL: 190 mg/dL (ref <200)
HDL: 47 mg/dL — ABNORMAL LOW (ref >50)
LDL Cholesterol (Calc): 113 mg/dL (calc) — ABNORMAL HIGH
Non-HDL Cholesterol (Calc): 143 mg/dL (calc) — ABNORMAL HIGH (ref <130)
Total CHOL/HDL Ratio: 4 (calc) (ref <5.0)
Triglycerides: 187 mg/dL — ABNORMAL HIGH (ref <150)

## 2018-03-16 ENCOUNTER — Encounter: Payer: Self-pay | Admitting: Internal Medicine

## 2018-03-16 ENCOUNTER — Ambulatory Visit: Payer: BLUE CROSS/BLUE SHIELD | Admitting: Internal Medicine

## 2018-03-16 VITALS — BP 130/88 | HR 85 | Ht 66.25 in | Wt 186.0 lb

## 2018-03-16 DIAGNOSIS — G2581 Restless legs syndrome: Secondary | ICD-10-CM | POA: Diagnosis not present

## 2018-03-16 DIAGNOSIS — F439 Reaction to severe stress, unspecified: Secondary | ICD-10-CM

## 2018-03-16 DIAGNOSIS — E782 Mixed hyperlipidemia: Secondary | ICD-10-CM | POA: Diagnosis not present

## 2018-03-17 DIAGNOSIS — G2581 Restless legs syndrome: Secondary | ICD-10-CM

## 2018-03-19 MED ORDER — ROPINIROLE HCL 1 MG PO TABS: 1.0000 mg | ORAL_TABLET | Freq: Every day | ORAL | 3 refills | Status: DC

## 2018-03-19 MED ORDER — ATORVASTATIN CALCIUM 40 MG PO TABS: ORAL_TABLET | ORAL | 3 refills | Status: DC

## 2018-04-04 MED ORDER — ROPINIROLE HCL 1 MG PO TABS: 1.0000 mg | ORAL_TABLET | Freq: Every day | ORAL | 3 refills | Status: DC

## 2018-04-04 NOTE — Progress Notes (Signed)
   Subjective:    Patient ID: Crystal Blake, female    DOB: Mar 08, 1956, 63 y.o.   MRN: [id number]  HPI She is here today to follow-up on hyperlipidemia.  Continues to have a lot of stress in her life.  Discussed at length again today.  There is financial stress and some issues with neighbors.  In September total cholesterol was 212 with triglycerides of 391 and an LDL of 104.  LDL is now 113, triglycerides improved to 187 and total cholesterol improved to 190.  She is currently on Lipitor 40 mg daily.  She is asking that Requip be refilled.  History of restless leg syndrome.  Remains on Lexapro for anxiety and depression.  Is on Tenormin for hypertension.  Review of Systems see above     Objective:   Physical Exam  Not examined but spent 10 minutes speaking with her about these results.  Blood pressure 130/88.  Continue Tenormin.  Weight is 186 pounds with BMI 29.80.      Assessment & Plan:  Situational stress-discussed.  Counseling recommended but patient worried about finances.  Is on SSRI.  Improvement in lipids with diet and statin medication.  Continue statin medication and encourage diet and exercise.  Elevated BMI 29.80-needs to lose weight  Hypertension stable but slightly elevated diastolic today at 88 continue to monitor.  Physical exam due September 2020.

## 2018-04-04 NOTE — Patient Instructions (Signed)
Consider counseling for situational stress.  Continue SSRI.  Continue current medications and follow-up September 2020.

## 2018-06-16 DIAGNOSIS — Z7183 Encounter for nonprocreative genetic counseling: Secondary | ICD-10-CM | POA: Diagnosis not present

## 2018-07-09 DIAGNOSIS — F900 Attention-deficit hyperactivity disorder, predominantly inattentive type: Secondary | ICD-10-CM | POA: Diagnosis not present

## 2018-07-09 DIAGNOSIS — F3342 Major depressive disorder, recurrent, in full remission: Secondary | ICD-10-CM | POA: Diagnosis not present

## 2018-07-09 DIAGNOSIS — F410 Panic disorder [episodic paroxysmal anxiety] without agoraphobia: Secondary | ICD-10-CM | POA: Diagnosis not present

## 2018-07-31 DIAGNOSIS — R140 Abdominal distension (gaseous): Secondary | ICD-10-CM | POA: Diagnosis not present

## 2018-08-07 ENCOUNTER — Other Ambulatory Visit: Payer: Self-pay | Admitting: Obstetrics and Gynecology

## 2018-08-11 ENCOUNTER — Other Ambulatory Visit: Payer: Self-pay | Admitting: Obstetrics and Gynecology

## 2018-08-11 DIAGNOSIS — Z803 Family history of malignant neoplasm of breast: Secondary | ICD-10-CM

## 2018-08-11 DIAGNOSIS — Z9189 Other specified personal risk factors, not elsewhere classified: Secondary | ICD-10-CM

## 2018-09-21 ENCOUNTER — Other Ambulatory Visit: Payer: Self-pay

## 2018-09-21 ENCOUNTER — Ambulatory Visit
Admission: RE | Admit: 2018-09-21 | Discharge: 2018-09-21 | Disposition: A | Discharge status: Home / Self Care | Payer: BC Managed Care – PPO | Source: Ambulatory Visit | Attending: Obstetrics and Gynecology | Admitting: Obstetrics and Gynecology

## 2018-09-21 DIAGNOSIS — N6321 Unspecified lump in the left breast, upper outer quadrant: Secondary | ICD-10-CM | POA: Diagnosis not present

## 2018-09-21 DIAGNOSIS — Z803 Family history of malignant neoplasm of breast: Secondary | ICD-10-CM | POA: Diagnosis not present

## 2018-09-21 DIAGNOSIS — N6322 Unspecified lump in the left breast, upper inner quadrant: Secondary | ICD-10-CM | POA: Diagnosis not present

## 2018-09-21 DIAGNOSIS — Z9189 Other specified personal risk factors, not elsewhere classified: Secondary | ICD-10-CM

## 2018-09-21 MED ORDER — GADOBUTROL 1 MMOL/ML IV SOLN
8.0000 mL | Freq: Once | INTRAVENOUS | Status: AC | PRN
  Administered 2018-09-21: 8 mL via INTRAVENOUS

## 2018-09-29 ENCOUNTER — Other Ambulatory Visit: Payer: Self-pay | Admitting: Obstetrics and Gynecology

## 2018-09-29 DIAGNOSIS — N6320 Unspecified lump in the left breast, unspecified quadrant: Secondary | ICD-10-CM

## 2018-09-29 DIAGNOSIS — N6489 Other specified disorders of breast: Secondary | ICD-10-CM

## 2018-10-01 ENCOUNTER — Ambulatory Visit
Admission: RE | Admit: 2018-10-01 | Discharge: 2018-10-01 | Disposition: A | Discharge status: Home / Self Care | Payer: BC Managed Care – PPO | Source: Ambulatory Visit | Attending: Obstetrics and Gynecology | Admitting: Obstetrics and Gynecology

## 2018-10-01 DIAGNOSIS — N6489 Other specified disorders of breast: Secondary | ICD-10-CM

## 2018-10-01 DIAGNOSIS — N6320 Unspecified lump in the left breast, unspecified quadrant: Secondary | ICD-10-CM | POA: Diagnosis not present

## 2018-10-02 ENCOUNTER — Other Ambulatory Visit: Payer: Self-pay | Admitting: Obstetrics and Gynecology

## 2018-10-02 DIAGNOSIS — R9389 Abnormal findings on diagnostic imaging of other specified body structures: Secondary | ICD-10-CM

## 2018-11-04 ENCOUNTER — Telehealth: Payer: Self-pay | Admitting: Internal Medicine

## 2018-11-04 NOTE — Telephone Encounter (Signed)
See at noon

## 2018-11-04 NOTE — Telephone Encounter (Signed)
Norrine Ballester [phone]  Bill called to say that Crystal Blake started having some Left Ear pain the end of last week and it has gotten worse, no fever, no COVID exposure.

## 2018-11-04 NOTE — Telephone Encounter (Signed)
Called patient to schedule for today and she wanted to wait until tomorrow.

## 2018-11-05 ENCOUNTER — Encounter: Payer: Self-pay | Admitting: Internal Medicine

## 2018-11-05 ENCOUNTER — Ambulatory Visit (INDEPENDENT_AMBULATORY_CARE_PROVIDER_SITE_OTHER): Payer: BC Managed Care – PPO | Admitting: Internal Medicine

## 2018-11-05 ENCOUNTER — Other Ambulatory Visit: Payer: Self-pay

## 2018-11-05 VITALS — BP 130/80 | HR 78 | Temp 98.3°F | Ht 66.25 in | Wt 198.0 lb

## 2018-11-05 DIAGNOSIS — H60332 Swimmer's ear, left ear: Secondary | ICD-10-CM | POA: Diagnosis not present

## 2018-11-05 DIAGNOSIS — H6692 Otitis media, unspecified, left ear: Secondary | ICD-10-CM | POA: Diagnosis not present

## 2018-11-05 MED ORDER — NEOMYCIN-POLYMYXIN-HC 3.5-10000-1 OT SOLN: 4.0000 [drp] | Freq: Four times a day (QID) | OTIC | 0 refills | Status: DC

## 2018-11-05 MED ORDER — AMOXICILLIN 500 MG PO CAPS: 500.0000 mg | ORAL_CAPSULE | Freq: Three times a day (TID) | ORAL | 0 refills | Status: DC

## 2018-11-06 ENCOUNTER — Ambulatory Visit
Admission: RE | Admit: 2018-11-06 | Discharge: 2018-11-06 | Disposition: A | Discharge status: Home / Self Care | Payer: BC Managed Care – PPO | Source: Ambulatory Visit | Attending: Obstetrics and Gynecology | Admitting: Obstetrics and Gynecology

## 2018-11-06 DIAGNOSIS — Z853 Personal history of malignant neoplasm of breast: Secondary | ICD-10-CM | POA: Diagnosis not present

## 2018-11-06 DIAGNOSIS — R9389 Abnormal findings on diagnostic imaging of other specified body structures: Secondary | ICD-10-CM

## 2018-11-06 MED ORDER — GADOBUTROL 1 MMOL/ML IV SOLN
9.0000 mL | Freq: Once | INTRAVENOUS | Status: AC | PRN
  Administered 2018-11-06: 9 mL via INTRAVENOUS

## 2018-11-21 NOTE — Patient Instructions (Signed)
Ear wick inserted left external ear canal.  Cortisporin otic suspension 4 drops left ear 4 times a day for 5 to 7 days.  Amoxicillin 500 mg 3 times a day for 10 days.  Remove the ear wick in a couple of days.

## 2018-11-21 NOTE — Progress Notes (Signed)
   Subjective:    Patient ID: Crystal Blake, female    DOB: 12-14-1955, 63 y.o.   MRN: [id number]  HPI 63 year old Female with complaint of left ear pain.  No known COVID-19 exposure.  She and her husband have been swimming and floating in an outdoor aboveground pool that they purchased from [employer].  Thinks she may have swimmer's ear.    Review of Systems see above no fever or chills, no nausea or vomiting     Objective:   Physical Exam Left external ear canal slightly reddened left TM full.  Pharynx is clear.  Neck supple.  No adenopathy.  Chest clear.  Right TM okay. Blood pressure 130/80.  Temperature 98.3 pulse 78 and regular pulse oximetry 90% Skin warm and dry      Assessment & Plan:  Left otitis media and externa  Plan: Cortisporin otic suspension 4 drops 4 times a day for 5 to 7 days.  Ear wick inserted into the left ear.  Amoxicillin 500 mg 3 times a day for 10 days.

## 2019-01-01 DIAGNOSIS — F331 Major depressive disorder, recurrent, moderate: Secondary | ICD-10-CM | POA: Diagnosis not present

## 2019-01-01 DIAGNOSIS — F410 Panic disorder [episodic paroxysmal anxiety] without agoraphobia: Secondary | ICD-10-CM | POA: Diagnosis not present

## 2019-01-12 ENCOUNTER — Encounter: Payer: Self-pay | Admitting: Internal Medicine

## 2019-01-12 ENCOUNTER — Telehealth: Payer: Self-pay | Admitting: Internal Medicine

## 2019-01-12 MED ORDER — ATENOLOL 50 MG PO TABS: ORAL_TABLET | ORAL | 1 refills | Status: DC

## 2019-01-12 NOTE — Telephone Encounter (Signed)
Refilled x 6 months 

## 2019-01-12 NOTE — Telephone Encounter (Signed)
Received Fax RX request from  [facility] [facility]  Medication - atenolol (TENORMIN) 50 MG tablet   Last Refill - 10/19/18  Last OV - 11/05/18  Last CPE - 12/11/17  Next CPE scheduled for 02/04/19

## 2019-02-02 ENCOUNTER — Other Ambulatory Visit: Payer: BC Managed Care – PPO | Admitting: Internal Medicine

## 2019-02-02 ENCOUNTER — Other Ambulatory Visit: Payer: Self-pay

## 2019-02-02 DIAGNOSIS — G2581 Restless legs syndrome: Secondary | ICD-10-CM

## 2019-02-02 DIAGNOSIS — E8881 Metabolic syndrome: Secondary | ICD-10-CM

## 2019-02-02 DIAGNOSIS — E559 Vitamin D deficiency, unspecified: Secondary | ICD-10-CM

## 2019-02-02 DIAGNOSIS — F900 Attention-deficit hyperactivity disorder, predominantly inattentive type: Secondary | ICD-10-CM

## 2019-02-02 DIAGNOSIS — F32A Depression, unspecified: Secondary | ICD-10-CM

## 2019-02-02 DIAGNOSIS — R7302 Impaired glucose tolerance (oral): Secondary | ICD-10-CM

## 2019-02-02 DIAGNOSIS — F329 Major depressive disorder, single episode, unspecified: Secondary | ICD-10-CM

## 2019-02-02 DIAGNOSIS — F439 Reaction to severe stress, unspecified: Secondary | ICD-10-CM

## 2019-02-02 DIAGNOSIS — F419 Anxiety disorder, unspecified: Secondary | ICD-10-CM

## 2019-02-02 DIAGNOSIS — I10 Essential (primary) hypertension: Secondary | ICD-10-CM

## 2019-02-02 DIAGNOSIS — E782 Mixed hyperlipidemia: Secondary | ICD-10-CM

## 2019-02-03 LAB — HEMOGLOBIN A1C
Hgb A1c MFr Bld: 5.8 % of total Hgb — ABNORMAL HIGH (ref <5.7)
Mean Plasma Glucose: 120 (calc)
eAG (mmol/L): 6.6 (calc)

## 2019-02-03 LAB — COMPLETE METABOLIC PANEL WITH GFR
AG Ratio: 2 (calc) (ref 1.0–2.5)
ALT: 64 U/L — ABNORMAL HIGH (ref 6–29)
AST: 44 U/L — ABNORMAL HIGH (ref 10–35)
Albumin: 4.9 g/dL (ref 3.6–5.1)
Alkaline phosphatase (APISO): 74 U/L (ref 37–153)
BUN: 17 mg/dL (ref 7–25)
CO2: 27 mmol/L (ref 20–32)
Calcium: 10.1 mg/dL (ref 8.6–10.4)
Chloride: 103 mmol/L (ref 98–110)
Creat: 0.71 mg/dL (ref 0.50–0.99)
GFR, Est African American: 107 mL/min/{1.73_m2} (ref >=60)
GFR, Est Non African American: 92 mL/min/{1.73_m2} (ref >=60)
Globulin: 2.4 g/dL (calc) (ref 1.9–3.7)
Glucose, Bld: 138 mg/dL — ABNORMAL HIGH (ref 65–99)
Potassium: 4.9 mmol/L (ref 3.5–5.3)
Sodium: 141 mmol/L (ref 135–146)
Total Bilirubin: 0.5 mg/dL (ref 0.2–1.2)
Total Protein: 7.3 g/dL (ref 6.1–8.1)

## 2019-02-03 LAB — MICROALBUMIN / CREATININE URINE RATIO
Creatinine, Urine: 190 mg/dL (ref 20–275)
Microalb Creat Ratio: 13 mcg/mg creat (ref <30)
Microalb, Ur: 2.4 mg/dL

## 2019-02-03 LAB — CBC WITH DIFFERENTIAL/PLATELET
Absolute Monocytes: 570 cells/uL (ref 200–950)
Basophils Absolute: 83 cells/uL (ref 0–200)
Basophils Relative: 1.1 %
Eosinophils Absolute: 203 cells/uL (ref 15–500)
Eosinophils Relative: 2.7 %
HCT: 47.8 % — ABNORMAL HIGH (ref 35.0–45.0)
Hemoglobin: 15.5 g/dL (ref 11.7–15.5)
Lymphs Abs: 2678 cells/uL (ref 850–3900)
MCH: 28.7 pg (ref 27.0–33.0)
MCHC: 32.4 g/dL (ref 32.0–36.0)
MCV: 88.5 fL (ref 80.0–100.0)
MPV: 9.1 fL (ref 7.5–12.5)
Monocytes Relative: 7.6 %
Neutro Abs: 3968 cells/uL (ref 1500–7800)
Neutrophils Relative %: 52.9 %
Platelets: 327 10*3/uL (ref 140–400)
RBC: 5.4 10*6/uL — ABNORMAL HIGH (ref 3.80–5.10)
RDW: 13.1 % (ref 11.0–15.0)
Total Lymphocyte: 35.7 %
WBC: 7.5 10*3/uL (ref 3.8–10.8)

## 2019-02-03 LAB — LIPID PANEL
Cholesterol: 190 mg/dL (ref <200)
HDL: 49 mg/dL — ABNORMAL LOW (ref >=50)
LDL Cholesterol (Calc): 97 mg/dL (calc)
Non-HDL Cholesterol (Calc): 141 mg/dL (calc) — ABNORMAL HIGH (ref <130)
Total CHOL/HDL Ratio: 3.9 (calc) (ref <5.0)
Triglycerides: 329 mg/dL — ABNORMAL HIGH (ref <150)

## 2019-02-03 LAB — VITAMIN D 25 HYDROXY (VIT D DEFICIENCY, FRACTURES): Vit D, 25-Hydroxy: 25 ng/mL — ABNORMAL LOW (ref 30–100)

## 2019-02-03 LAB — TSH: TSH: 1.7 mIU/L (ref 0.40–4.50)

## 2019-02-04 ENCOUNTER — Ambulatory Visit (INDEPENDENT_AMBULATORY_CARE_PROVIDER_SITE_OTHER): Payer: BC Managed Care – PPO | Admitting: Internal Medicine

## 2019-02-04 ENCOUNTER — Other Ambulatory Visit: Payer: Self-pay

## 2019-02-04 ENCOUNTER — Encounter: Payer: Self-pay | Admitting: Internal Medicine

## 2019-02-04 VITALS — BP 100/60 | HR 84 | Temp 98.6°F | Ht 66.25 in | Wt 194.0 lb

## 2019-02-04 DIAGNOSIS — F419 Anxiety disorder, unspecified: Secondary | ICD-10-CM

## 2019-02-04 DIAGNOSIS — F32A Depression, unspecified: Secondary | ICD-10-CM

## 2019-02-04 DIAGNOSIS — Z23 Encounter for immunization: Secondary | ICD-10-CM | POA: Diagnosis not present

## 2019-02-04 DIAGNOSIS — Z6831 Body mass index (BMI) 31.0-31.9, adult: Secondary | ICD-10-CM

## 2019-02-04 DIAGNOSIS — E8881 Metabolic syndrome: Secondary | ICD-10-CM

## 2019-02-04 DIAGNOSIS — E782 Mixed hyperlipidemia: Secondary | ICD-10-CM

## 2019-02-04 DIAGNOSIS — R7302 Impaired glucose tolerance (oral): Secondary | ICD-10-CM

## 2019-02-04 DIAGNOSIS — F439 Reaction to severe stress, unspecified: Secondary | ICD-10-CM | POA: Diagnosis not present

## 2019-02-04 DIAGNOSIS — I10 Essential (primary) hypertension: Secondary | ICD-10-CM

## 2019-02-04 DIAGNOSIS — E559 Vitamin D deficiency, unspecified: Secondary | ICD-10-CM | POA: Diagnosis not present

## 2019-02-04 DIAGNOSIS — F900 Attention-deficit hyperactivity disorder, predominantly inattentive type: Secondary | ICD-10-CM

## 2019-02-04 DIAGNOSIS — G2581 Restless legs syndrome: Secondary | ICD-10-CM

## 2019-02-04 DIAGNOSIS — Z0000 Encounter for general adult medical examination without abnormal findings: Secondary | ICD-10-CM | POA: Diagnosis not present

## 2019-02-04 DIAGNOSIS — F329 Major depressive disorder, single episode, unspecified: Secondary | ICD-10-CM

## 2019-02-04 LAB — POCT URINALYSIS DIPSTICK
Appearance: NEGATIVE
Bilirubin, UA: NEGATIVE
Blood, UA: NEGATIVE
Glucose, UA: NEGATIVE
Ketones, UA: NEGATIVE
Leukocytes, UA: NEGATIVE
Nitrite, UA: NEGATIVE
Odor: NEGATIVE
Protein, UA: NEGATIVE
Spec Grav, UA: 1.01 (ref 1.010–1.025)
Urobilinogen, UA: 0.2 E.U./dL
pH, UA: 6 (ref 5.0–8.0)

## 2019-02-04 MED ORDER — METFORMIN HCL ER 500 MG PO TB24: 500.0000 mg | ORAL_TABLET | Freq: Every day | ORAL | 1 refills | Status: DC

## 2019-02-04 MED ORDER — ROSUVASTATIN CALCIUM 20 MG PO TABS: 20.0000 mg | ORAL_TABLET | Freq: Every day | ORAL | 2 refills | Status: DC

## 2019-02-04 NOTE — Patient Instructions (Addendum)
Take 4000 units vitamin D3 daily. Does not want to see Dr. Leafy Ro.  Watch diet and get more exercise.  Follow-up in February.  Start Metformin.  Changed to Crestor 20 mg daily.
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
## 2019-03-03 DIAGNOSIS — R350 Frequency of micturition: Secondary | ICD-10-CM | POA: Diagnosis not present

## 2019-03-03 DIAGNOSIS — R8271 Bacteriuria: Secondary | ICD-10-CM | POA: Diagnosis not present

## 2019-03-03 DIAGNOSIS — R351 Nocturia: Secondary | ICD-10-CM | POA: Diagnosis not present

## 2019-04-12 ENCOUNTER — Telehealth: Payer: Self-pay | Admitting: Internal Medicine

## 2019-04-12 MED ORDER — ATENOLOL 50 MG PO TABS: ORAL_TABLET | ORAL | 1 refills | Status: DC

## 2019-04-12 NOTE — Telephone Encounter (Signed)
Received Fax RX request from  [id number] - [place], [place] Phone:  [phone]  Fax:  [phone]       Medication - atenolol (TENORMIN) 50 MG tablet    Last Refill - 03/21/19  Last OV - 02/04/19  Last CPE - 02/04/19  Next Appointment - 05/20/2019

## 2019-05-07 DIAGNOSIS — Z01419 Encounter for gynecological examination (general) (routine) without abnormal findings: Secondary | ICD-10-CM | POA: Diagnosis not present

## 2019-05-07 DIAGNOSIS — Z1231 Encounter for screening mammogram for malignant neoplasm of breast: Secondary | ICD-10-CM | POA: Diagnosis not present

## 2019-05-07 DIAGNOSIS — Z113 Encounter for screening for infections with a predominantly sexual mode of transmission: Secondary | ICD-10-CM | POA: Diagnosis not present

## 2019-05-07 DIAGNOSIS — Z6829 Body mass index (BMI) 29.0-29.9, adult: Secondary | ICD-10-CM | POA: Diagnosis not present

## 2019-05-07 DIAGNOSIS — N760 Acute vaginitis: Secondary | ICD-10-CM | POA: Diagnosis not present

## 2019-05-17 ENCOUNTER — Other Ambulatory Visit: Payer: Self-pay

## 2019-05-17 ENCOUNTER — Other Ambulatory Visit: Payer: BC Managed Care – PPO | Admitting: Internal Medicine

## 2019-05-17 DIAGNOSIS — R7302 Impaired glucose tolerance (oral): Secondary | ICD-10-CM | POA: Diagnosis not present

## 2019-05-17 DIAGNOSIS — E782 Mixed hyperlipidemia: Secondary | ICD-10-CM | POA: Diagnosis not present

## 2019-05-18 LAB — HEPATIC FUNCTION PANEL
AG Ratio: 1.7 (calc) (ref 1.0–2.5)
ALT: 47 U/L — ABNORMAL HIGH (ref 6–29)
AST: 41 U/L — ABNORMAL HIGH (ref 10–35)
Albumin: 4.6 g/dL (ref 3.6–5.1)
Alkaline phosphatase (APISO): 71 U/L (ref 37–153)
Bilirubin, Direct: 0.1 mg/dL (ref 0.0–0.2)
Globulin: 2.7 g/dL (calc) (ref 1.9–3.7)
Indirect Bilirubin: 0.5 mg/dL (calc) (ref 0.2–1.2)
Total Bilirubin: 0.6 mg/dL (ref 0.2–1.2)
Total Protein: 7.3 g/dL (ref 6.1–8.1)

## 2019-05-18 LAB — LIPID PANEL
Cholesterol: 191 mg/dL (ref <200)
HDL: 52 mg/dL (ref >=50)
LDL Cholesterol (Calc): 100 mg/dL (calc) — ABNORMAL HIGH
Non-HDL Cholesterol (Calc): 139 mg/dL (calc) — ABNORMAL HIGH (ref <130)
Total CHOL/HDL Ratio: 3.7 (calc) (ref <5.0)
Triglycerides: 276 mg/dL — ABNORMAL HIGH (ref <150)

## 2019-05-18 LAB — HEMOGLOBIN A1C
Hgb A1c MFr Bld: 5.8 % of total Hgb — ABNORMAL HIGH (ref <5.7)
Mean Plasma Glucose: 120 (calc)
eAG (mmol/L): 6.6 (calc)

## 2019-05-20 ENCOUNTER — Ambulatory Visit (INDEPENDENT_AMBULATORY_CARE_PROVIDER_SITE_OTHER): Payer: BC Managed Care – PPO | Admitting: Internal Medicine

## 2019-05-20 ENCOUNTER — Other Ambulatory Visit: Payer: Self-pay

## 2019-05-20 ENCOUNTER — Encounter: Payer: Self-pay | Admitting: Internal Medicine

## 2019-05-20 VITALS — BP 110/60 | HR 95 | Ht 66.25 in | Wt 188.0 lb

## 2019-05-20 DIAGNOSIS — F439 Reaction to severe stress, unspecified: Secondary | ICD-10-CM | POA: Diagnosis not present

## 2019-05-20 DIAGNOSIS — F32A Depression, unspecified: Secondary | ICD-10-CM

## 2019-05-20 DIAGNOSIS — F419 Anxiety disorder, unspecified: Secondary | ICD-10-CM

## 2019-05-20 DIAGNOSIS — Z6830 Body mass index (BMI) 30.0-30.9, adult: Secondary | ICD-10-CM

## 2019-05-20 DIAGNOSIS — R7302 Impaired glucose tolerance (oral): Secondary | ICD-10-CM

## 2019-05-20 DIAGNOSIS — E8881 Metabolic syndrome: Secondary | ICD-10-CM

## 2019-05-20 DIAGNOSIS — I10 Essential (primary) hypertension: Secondary | ICD-10-CM

## 2019-05-20 DIAGNOSIS — G2581 Restless legs syndrome: Secondary | ICD-10-CM

## 2019-05-20 DIAGNOSIS — E782 Mixed hyperlipidemia: Secondary | ICD-10-CM | POA: Diagnosis not present

## 2019-05-20 DIAGNOSIS — F329 Major depressive disorder, single episode, unspecified: Secondary | ICD-10-CM

## 2019-05-20 NOTE — Progress Notes (Addendum)
   Subjective:    Patient ID: Crystal Blake, female    DOB: November 03, 1955, 64 y.o.   MRN: [id number]  HPI 64 year old Female with history of obesity, depression, attention deficit disorder, anxiety, allergic rhinitis, hyperlipidemia, hypertension, impaired glucose tolerance, restless leg syndrome and situational stress and for 59-month recheck.  Continues with situational stress.  Really not motivated to diet and exercise.  Triglycerides are elevated at 276 and previously of 329 in November.  Feels that she is eating better.  Is on statin medication.  Hemoglobin A1c 5.8% and stable  History of vitamin D deficiency with vitamin D level of 25 in November.  Recommend 5000 units vitamin D3 daily.  History of restless leg syndrome treated with Requip.  Hypertension stable on Tenormin.  Dr. Philis Pique is GYN physician.  Plans to receive COVID-19 vaccine  Review of Systems pt says she was in constant lower abdominal pain recently but did not contact office. She was constipated apparently   Was started on Trintellix in November by Dr. Toy Care and previously was on Lexapro.  Dr. Toy Care put her on low-dose thyroid replacement medication.  Also taking Xanax 1 mg daily.     Objective:   Physical Exam Blood pressure 110/60 pulse 95 pulse oximetry 99% weight 188 pounds BMI 30.12.  Skin warm and dry.  Nodes none.  Chest clear to auscultation.  No thyromegaly.  Cardiac exam regular rate and rhythm.  No lower extremity edema.  Affect is normal.       Assessment & Plan:  BMI 30.1 today-continue diet exercise and weight loss regimen  Essential hypertension stable on current regimen  Anxiety and depression treated by Dr. Toy Care with Trintellix and Xanax.  Is also on low-dose thyroid replacement by Dr. Toy Care.  Hyperlipidemia continue Lipitor 40 mg daily  Impaired glucose tolerance stable at 5.8%  Situational stress-neither she nor her husband are working at present time  Restless leg syndrome treated with  Requip  Plan: Continue current medications and follow-up in 6 months.

## 2019-06-10 ENCOUNTER — Ambulatory Visit: Payer: BC Managed Care – PPO | Attending: Internal Medicine

## 2019-06-10 DIAGNOSIS — Z23 Encounter for immunization: Secondary | ICD-10-CM

## 2019-06-10 NOTE — Progress Notes (Signed)
   Covid-19 Vaccination Clinic  Name:  Crystal Blake    MRN: [id number] DOB: 1955-08-11  06/10/2019  [name] was observed post Covid-19 immunization for 15 minutes without incident. She was provided with Vaccine Information Sheet and instruction to access the V-Safe system.   [name] was instructed to call 911 with any severe reactions post vaccine: Marland Kitchen Difficulty breathing  . Swelling of face and throat  . A fast heartbeat  . A bad rash all over body  . Dizziness and weakness   Immunizations Administered    Name Date Dose VIS Date Route   Pfizer COVID-19 Vaccine 06/10/2019  3:24 PM 0.3 mL 03/05/2019 Intramuscular   Manufacturer: [place]   Lot: [id number]   [place]: [id number]

## 2019-06-12 NOTE — Patient Instructions (Signed)
Continue current medications and follow-up in 6 months.  Continue diet, exercise and weight loss efforts.

## 2019-06-18 ENCOUNTER — Telehealth: Payer: Self-pay | Admitting: Internal Medicine

## 2019-06-18 NOTE — Telephone Encounter (Signed)
Patient canceled 6 week recheck thru [facility] chart, was going to check A1C levels,- message below   Need to re-sked for 6 months per: "AFTER VISIT SUMMARY. 05/20/2019 3:45 PM Emeline General, MD. Instructions: Return in about 6 months (around 11/17/2019)" Additionally, gastric system can't tolerate metformin. Stopped taking metformin early March.

## 2019-06-25 DIAGNOSIS — F900 Attention-deficit hyperactivity disorder, predominantly inattentive type: Secondary | ICD-10-CM | POA: Diagnosis not present

## 2019-06-25 DIAGNOSIS — F410 Panic disorder [episodic paroxysmal anxiety] without agoraphobia: Secondary | ICD-10-CM | POA: Diagnosis not present

## 2019-06-25 DIAGNOSIS — F3342 Major depressive disorder, recurrent, in full remission: Secondary | ICD-10-CM | POA: Diagnosis not present

## 2019-06-29 ENCOUNTER — Other Ambulatory Visit: Payer: BC Managed Care – PPO | Admitting: Internal Medicine

## 2019-07-01 ENCOUNTER — Ambulatory Visit: Payer: BC Managed Care – PPO | Admitting: Internal Medicine

## 2019-07-07 ENCOUNTER — Ambulatory Visit: Payer: BC Managed Care – PPO | Attending: Internal Medicine

## 2019-07-07 DIAGNOSIS — Z23 Encounter for immunization: Secondary | ICD-10-CM

## 2019-07-07 NOTE — Progress Notes (Signed)
   Covid-19 Vaccination Clinic  Name:  Crystal Blake    MRN: [id number] DOB: 08/04/1955  07/07/2019  [name] was observed post Covid-19 immunization for 15 minutes without incident. She was provided with Vaccine Information Sheet and instruction to access the V-Safe system.   [name] was instructed to call 911 with any severe reactions post vaccine: Marland Kitchen Difficulty breathing  . Swelling of face and throat  . A fast heartbeat  . A bad rash all over body  . Dizziness and weakness   Immunizations Administered    Name Date Dose VIS Date Route   Pfizer COVID-19 Vaccine 07/07/2019  2:56 PM 0.3 mL 03/05/2019 Intramuscular   Manufacturer: [place]   Lot: [id number]   [place]: [id number]

## 2019-07-23 ENCOUNTER — Other Ambulatory Visit: Payer: Self-pay

## 2019-07-23 MED ORDER — ATORVASTATIN CALCIUM 40 MG PO TABS: ORAL_TABLET | ORAL | 1 refills | Status: DC

## 2019-08-19 ENCOUNTER — Other Ambulatory Visit: Payer: Self-pay

## 2019-08-19 DIAGNOSIS — G2581 Restless legs syndrome: Secondary | ICD-10-CM

## 2019-08-19 MED ORDER — ROPINIROLE HCL 1 MG PO TABS: 1.0000 mg | ORAL_TABLET | Freq: Every day | ORAL | 3 refills | Status: AC

## 2019-09-16 IMAGING — MR MR BILATERAL BREAST WITHOUT AND WITH CONTRAST
7 of 12 series · 24 of 48 positions shown · IV contrast (9 ml gadavist)
Comparison: Bilateral screening mammogram December 09, 2017.
COMPARISON: Bilateral screening mammogram December 09, 2017.

Addendum:
CLINICAL DATA: 63-year-old patient presents for screening breast
MRI. Family history of breast cancer in paternal grandmother at age
60 and paternal aunt at age 50.

LABS:  Creatinine was obtained on site at [HOSPITAL] at [REDACTED] [HOSPITAL].
Results: Creatinine 0.8 mg/dL.
EXAM:
BILATERAL BREAST MRI WITH AND WITHOUT CONTRAST
TECHNIQUE: Multiplanar, multisequence MR images of both breasts were obtained
prior to and following the intravenous administration of 9 ml of
Gadavist

[Series 2: t2_tirm_tra ipat (a-p) · axial · 3.0mm · 0.70mm/px · 1 of 55 slices shown]
[im 1/55]
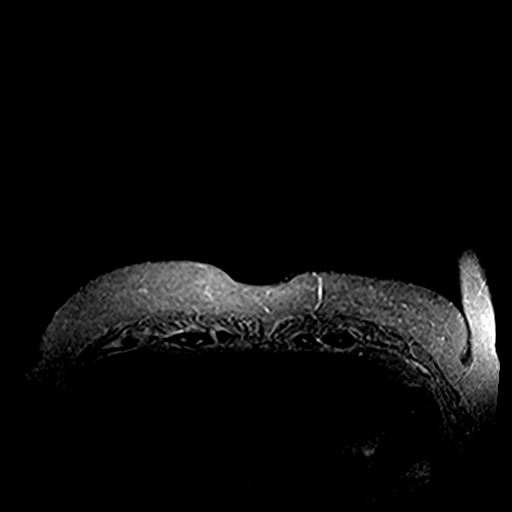

[Series 3: fl3d pre-cm no · axial · non-contrast · 1.2mm · 0.94mm/px · z∈[-72,+99]mm · 5 of 144 slices shown]
[im 1/144]
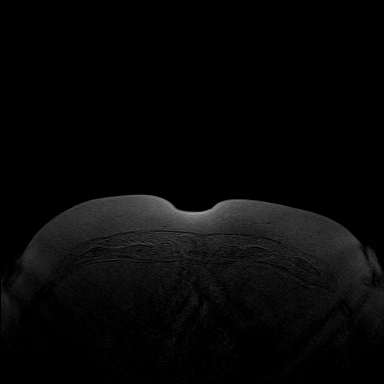
[im 36/144]
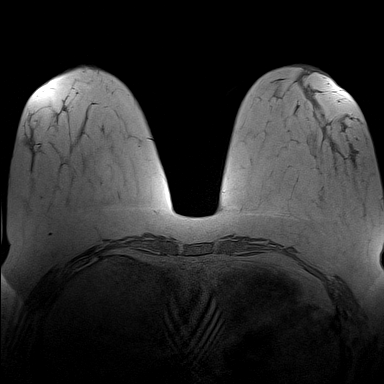
[im 72/144]
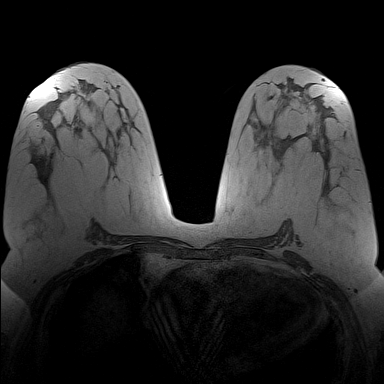
[im 108/144]
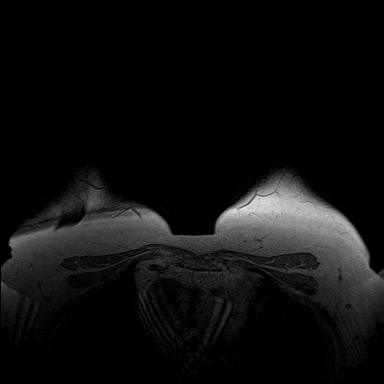
[im 144/144]
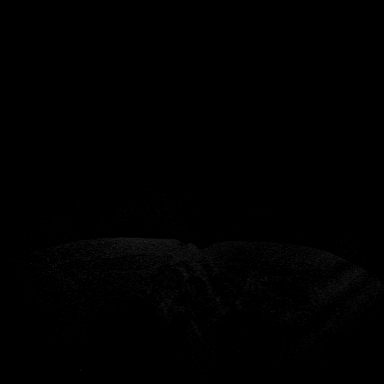

[Series 4: fl3d pre-cm · axial · non-contrast · 1.2mm · 0.94mm/px · z∈[-72,+99]mm · 5 of 144 slices shown]
[im 1/144]
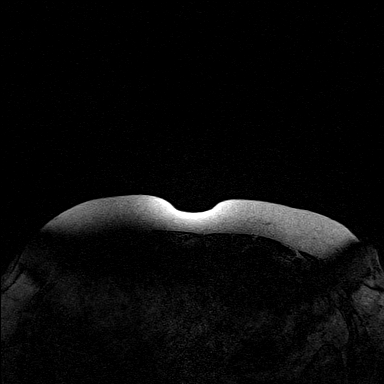
[im 36/144]
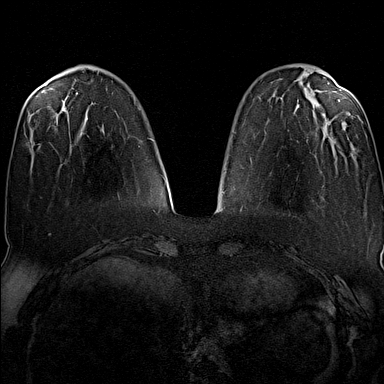
[im 72/144]
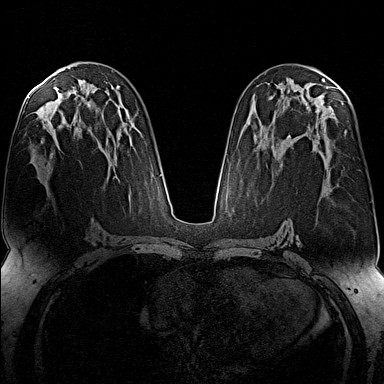
[im 108/144]
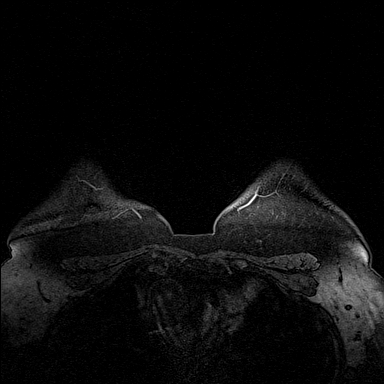
[im 144/144]
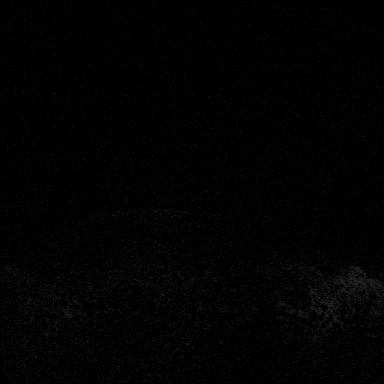

[Series 5: fl3d post-cm 20 · axial · 1.2mm · 0.94mm/px · z∈[-72,+99]mm · 5 of 144 slices shown (1 of 3)]
[im 1/144]
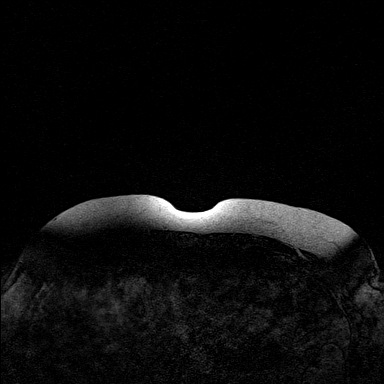
[im 36/144]
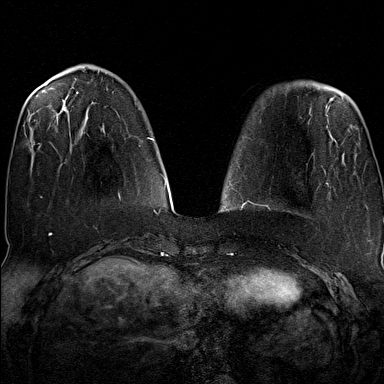
[im 72/144]
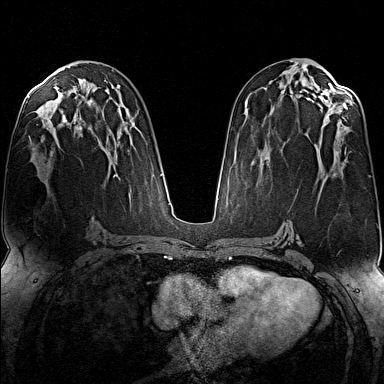
[im 108/144]
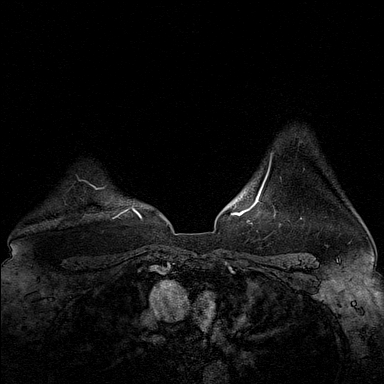
[im 144/144]
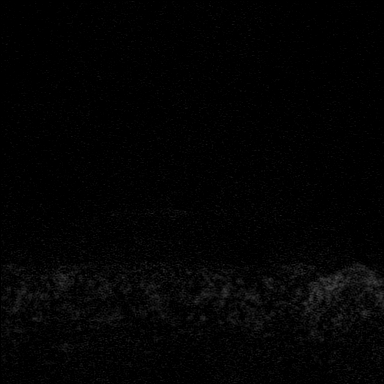

[Series 6: fl3d post-cm 20 · axial · 1.2mm · 0.94mm/px · z∈[-72,+99]mm · 5 of 144 slices shown (2 of 3)]
[im 1/144]
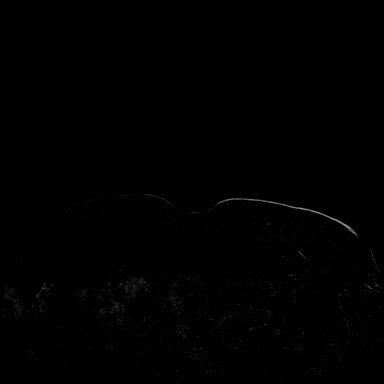
[im 36/144]
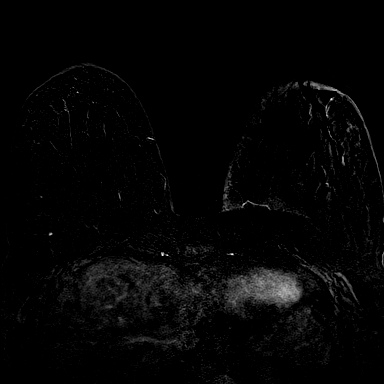
[im 72/144]
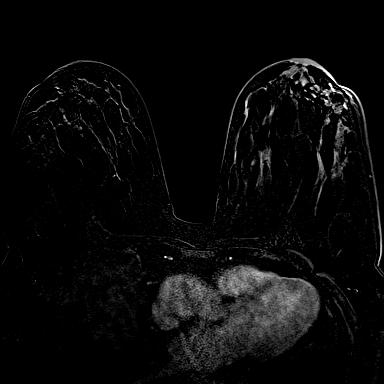
[im 108/144]
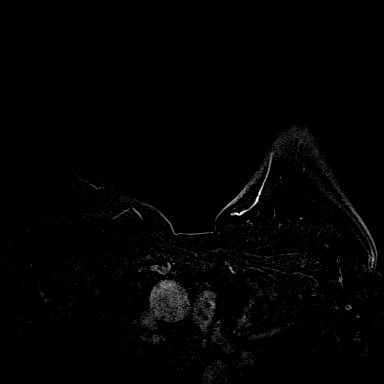
[im 144/144]
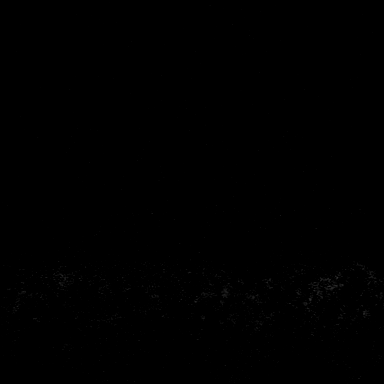

[Series 7: fl3d post-cm 20 · axial · 172.8mm · 0.94mm/px · 1 of 1 slices shown (3 of 3)]
[im 1/1]
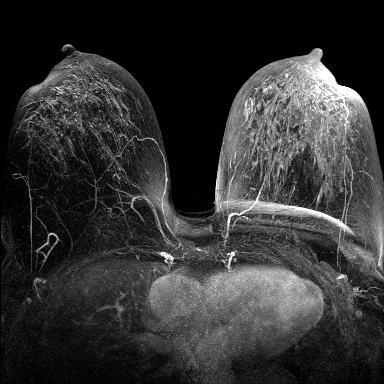

[Series 8: fl3d post-cm 3min · axial · 1.2mm · 0.94mm/px · z∈[-72,-39]mm · 2 of 144 slices shown]
[im 1/144]
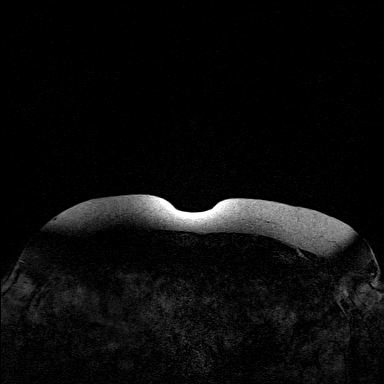
[im 29/144]
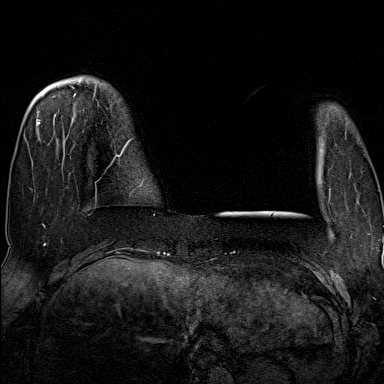

[24 of 48 positions shown; findings below may reference images not displayed]

Three-dimensional MR images were rendered by post-processing of the
original MR data on an independent workstation. The
three-dimensional MR images were interpreted, and findings are
reported in the following complete MRI report for this study. Three
dimensional images were evaluated at the independent [employer]
workstation
FINDINGS: Breast composition: c. Heterogeneous fibroglandular tissue.

Background parenchymal enhancement: Mild

Right breast: There are scattered foci of enhancement throughout the
right breast. No suspicious enhancement stands out above the
background of scattered foci. No irregular mass or suspicious
nonmass enhancement. The presence of the scattered foci does
decrease sensitivity for detecting malignancy.

Left breast: Markedly asymmetric linear/ductal enhancement in a
segmental distribution in the retroareolar and outer left breast,
with mixed enhancement curves, including washout. The anterior to
posterior extent of this ductal enhancement involves the anterior,
middle, and a portion of the posterior third of the breast
parenchyma. Anterior to posterior extent is approximately 11.5 cm.
Some of the ductal enhancement does involve the anterior third of
the medial left breast, and in total the transverse extent of the
enhancement is approximately 7 cm. The superior to inferior extent
is approximately 6.7 cm, involving both the superior and inferior
breast.

In the immediate retroareolar left breast, approximately 12 o'clock
retroareolar, is an irregular enhancing mass measuring 1.4 x 1.7 x
1.0 cm.

The entire left nipple markedly enhances, which is asymmetric to the
contralateral right nipple, which shows minimal to no enhancement.

Lymph nodes: No abnormal appearing lymph nodes.

Ancillary findings:  None.
IMPRESSION: Suspicious ductal/linear enhancement involving a large portion of
the lateral left breast and a small portion of the anterior third of
the medial left breast. Additionally, there is a suspicious
irregular mass in the immediate retroareolar left breast. The entire
left nipple enhances intensely, which is an asymmetric finding
compared to the right breast. Findings are suspicious for invasive
and ductal carcinoma of the left breast with possible involvement of
the left nipple.

Scattered foci throughout the right breast with no focal suspicious
areas of enhancement above the background parenchymal pattern.

Negative for axillary lymphadenopathy.

RECOMMENDATION:
A diagnostic left mammogram and left breast ultrasound is
recommended to evaluate for mammographic and sonographic correlates
to the suspicious findings seen on breast MRI, to target for biopsy.
If no mammographic or sonographic correlates are identified, or if
the entire extent of the suspected disease is not well demonstrated
on these modalities, then MRI guided breast biopsies could be
scheduled, or an MRI guided biopsy could be scheduled in addition to
biopsy with another modality.

Diagnostic left mammogram and left breast ultrasound is recommended,
and this will be scheduled for the patient.

BI-RADS CATEGORY  5: Highly suggestive of malignancy.

ADDENDUM:
The patient was seen for a diagnostic left mammogram and left breast
ultrasound performed on October 01, 2018. The left breast appeared
normal on both of these modalities, with no suspicious findings or
interval changes.

It is suspected that the left breast shifted slightly in position
between the precontrast and postcontrast images during the breast
MRI of 09/21/2018, creating significant misregistration artifact,
resulting in the apparent suspicious enhancement.

A repeat bilateral breast MRI with contrast, at no additional
charge, is recommended to both clarify if the findings on the
original screening breast MRI were artifactual due to slight
movement, as well as completely screening the left breast, as
suspicious areas of enhancement could be obscured by the artifact.

These findings and recommendations were discussed by telephone with
the patient today October 02, 2018 by Dr. Parson and the patient will
be scheduled for repeat breast MRI, at no additional charge.

*** End of Addendum ***
Three-dimensional MR images were rendered by post-processing of the
original MR data on an independent workstation. The
three-dimensional MR images were interpreted, and findings are
reported in the following complete MRI report for this study. Three
dimensional images were evaluated at the independent [employer]
workstation
FINDINGS: Breast composition: c. Heterogeneous fibroglandular tissue.

Background parenchymal enhancement: Mild

Right breast: There are scattered foci of enhancement throughout the
right breast. No suspicious enhancement stands out above the
background of scattered foci. No irregular mass or suspicious
nonmass enhancement. The presence of the scattered foci does
decrease sensitivity for detecting malignancy.

Left breast: Markedly asymmetric linear/ductal enhancement in a
segmental distribution in the retroareolar and outer left breast,
with mixed enhancement curves, including washout. The anterior to
posterior extent of this ductal enhancement involves the anterior,
middle, and a portion of the posterior third of the breast
parenchyma. Anterior to posterior extent is approximately 11.5 cm.
Some of the ductal enhancement does involve the anterior third of
the medial left breast, and in total the transverse extent of the
enhancement is approximately 7 cm. The superior to inferior extent
is approximately 6.7 cm, involving both the superior and inferior
breast.

In the immediate retroareolar left breast, approximately 12 o'clock
retroareolar, is an irregular enhancing mass measuring 1.4 x 1.7 x
1.0 cm.

The entire left nipple markedly enhances, which is asymmetric to the
contralateral right nipple, which shows minimal to no enhancement.

Lymph nodes: No abnormal appearing lymph nodes.

Ancillary findings:  None.
IMPRESSION: Suspicious ductal/linear enhancement involving a large portion of
the lateral left breast and a small portion of the anterior third of
the medial left breast. Additionally, there is a suspicious
irregular mass in the immediate retroareolar left breast. The entire
left nipple enhances intensely, which is an asymmetric finding
compared to the right breast. Findings are suspicious for invasive
and ductal carcinoma of the left breast with possible involvement of
the left nipple.

Scattered foci throughout the right breast with no focal suspicious
areas of enhancement above the background parenchymal pattern.

Negative for axillary lymphadenopathy.

RECOMMENDATION:
A diagnostic left mammogram and left breast ultrasound is
recommended to evaluate for mammographic and sonographic correlates
to the suspicious findings seen on breast MRI, to target for biopsy.
If no mammographic or sonographic correlates are identified, or if
the entire extent of the suspected disease is not well demonstrated
on these modalities, then MRI guided breast biopsies could be
scheduled, or an MRI guided biopsy could be scheduled in addition to
biopsy with another modality.

Diagnostic left mammogram and left breast ultrasound is recommended,
and this will be scheduled for the patient.

BI-RADS CATEGORY  5: Highly suggestive of malignancy.

## 2019-10-20 DIAGNOSIS — L409 Psoriasis, unspecified: Secondary | ICD-10-CM | POA: Diagnosis not present

## 2019-11-30 ENCOUNTER — Ambulatory Visit: Payer: Self-pay | Attending: Internal Medicine

## 2019-11-30 DIAGNOSIS — Z23 Encounter for immunization: Secondary | ICD-10-CM

## 2019-11-30 NOTE — Progress Notes (Signed)
   Covid-19 Vaccination Clinic  Name:  Izumi Mrytle Bento    MRN: [id number] DOB: 22-Apr-1955  11/30/2019  Ms. Rosen was observed post Covid-19 immunization for 15 minutes without incident. She was provided with Vaccine Information Sheet and instruction to access the V-Safe system.   Ms. Stankovich was instructed to call 911 with any severe reactions post vaccine: Marland Kitchen Difficulty breathing  . Swelling of face and throat  . A fast heartbeat  . A bad rash all over body  . Dizziness and weakness

## 2019-12-13 DIAGNOSIS — F900 Attention-deficit hyperactivity disorder, predominantly inattentive type: Secondary | ICD-10-CM | POA: Diagnosis not present

## 2019-12-13 DIAGNOSIS — F410 Panic disorder [episodic paroxysmal anxiety] without agoraphobia: Secondary | ICD-10-CM | POA: Diagnosis not present

## 2019-12-13 DIAGNOSIS — F3342 Major depressive disorder, recurrent, in full remission: Secondary | ICD-10-CM | POA: Diagnosis not present

## 2019-12-13 DIAGNOSIS — F4322 Adjustment disorder with anxiety: Secondary | ICD-10-CM | POA: Diagnosis not present

## 2020-01-07 ENCOUNTER — Other Ambulatory Visit: Payer: Self-pay | Admitting: Internal Medicine

## 2020-01-10 DIAGNOSIS — L409 Psoriasis, unspecified: Secondary | ICD-10-CM | POA: Diagnosis not present

## 2020-01-10 DIAGNOSIS — L821 Other seborrheic keratosis: Secondary | ICD-10-CM | POA: Diagnosis not present

## 2020-01-31 ENCOUNTER — Other Ambulatory Visit: Payer: Self-pay | Admitting: Internal Medicine

## 2020-02-04 ENCOUNTER — Other Ambulatory Visit: Payer: Self-pay

## 2020-02-04 ENCOUNTER — Other Ambulatory Visit: Payer: BC Managed Care – PPO | Admitting: Internal Medicine

## 2020-02-04 DIAGNOSIS — F900 Attention-deficit hyperactivity disorder, predominantly inattentive type: Secondary | ICD-10-CM

## 2020-02-04 DIAGNOSIS — F32A Depression, unspecified: Secondary | ICD-10-CM

## 2020-02-04 DIAGNOSIS — F419 Anxiety disorder, unspecified: Secondary | ICD-10-CM

## 2020-02-04 DIAGNOSIS — E8881 Metabolic syndrome: Secondary | ICD-10-CM | POA: Diagnosis not present

## 2020-02-04 DIAGNOSIS — E782 Mixed hyperlipidemia: Secondary | ICD-10-CM

## 2020-02-04 DIAGNOSIS — R7302 Impaired glucose tolerance (oral): Secondary | ICD-10-CM

## 2020-02-04 DIAGNOSIS — Z0000 Encounter for general adult medical examination without abnormal findings: Secondary | ICD-10-CM

## 2020-02-04 DIAGNOSIS — Z6831 Body mass index (BMI) 31.0-31.9, adult: Secondary | ICD-10-CM

## 2020-02-04 DIAGNOSIS — I10 Essential (primary) hypertension: Secondary | ICD-10-CM | POA: Diagnosis not present

## 2020-02-04 DIAGNOSIS — E559 Vitamin D deficiency, unspecified: Secondary | ICD-10-CM

## 2020-02-04 DIAGNOSIS — G2581 Restless legs syndrome: Secondary | ICD-10-CM

## 2020-02-05 LAB — CBC WITH DIFFERENTIAL/PLATELET
Absolute Monocytes: 642 cells/uL (ref 200–950)
Basophils Absolute: 58 cells/uL (ref 0–200)
Basophils Relative: 0.8 %
Eosinophils Absolute: 212 cells/uL (ref 15–500)
Eosinophils Relative: 2.9 %
HCT: 45.4 % — ABNORMAL HIGH (ref 35.0–45.0)
Hemoglobin: 15.4 g/dL (ref 11.7–15.5)
Lymphs Abs: 2679 cells/uL (ref 850–3900)
MCH: 29.7 pg (ref 27.0–33.0)
MCHC: 33.9 g/dL (ref 32.0–36.0)
MCV: 87.5 fL (ref 80.0–100.0)
MPV: 9.1 fL (ref 7.5–12.5)
Monocytes Relative: 8.8 %
Neutro Abs: 3708 cells/uL (ref 1500–7800)
Neutrophils Relative %: 50.8 %
Platelets: 339 10*3/uL (ref 140–400)
RBC: 5.19 10*6/uL — ABNORMAL HIGH (ref 3.80–5.10)
RDW: 12.8 % (ref 11.0–15.0)
Total Lymphocyte: 36.7 %
WBC: 7.3 10*3/uL (ref 3.8–10.8)

## 2020-02-05 LAB — LIPID PANEL
Cholesterol: 185 mg/dL (ref <200)
HDL: 47 mg/dL — ABNORMAL LOW (ref >=50)
LDL Cholesterol (Calc): 101 mg/dL (calc) — ABNORMAL HIGH
Non-HDL Cholesterol (Calc): 138 mg/dL (calc) — ABNORMAL HIGH (ref <130)
Total CHOL/HDL Ratio: 3.9 (calc) (ref <5.0)
Triglycerides: 256 mg/dL — ABNORMAL HIGH (ref <150)

## 2020-02-05 LAB — VITAMIN D 25 HYDROXY (VIT D DEFICIENCY, FRACTURES): Vit D, 25-Hydroxy: 37 ng/mL (ref 30–100)

## 2020-02-05 LAB — COMPLETE METABOLIC PANEL WITH GFR
AG Ratio: 1.8 (calc) (ref 1.0–2.5)
ALT: 38 U/L — ABNORMAL HIGH (ref 6–29)
AST: 24 U/L (ref 10–35)
Albumin: 4.4 g/dL (ref 3.6–5.1)
Alkaline phosphatase (APISO): 70 U/L (ref 37–153)
BUN: 14 mg/dL (ref 7–25)
CO2: 23 mmol/L (ref 20–32)
Calcium: 9.6 mg/dL (ref 8.6–10.4)
Chloride: 108 mmol/L (ref 98–110)
Creat: 0.71 mg/dL (ref 0.50–0.99)
GFR, Est African American: 106 mL/min/{1.73_m2} (ref >=60)
GFR, Est Non African American: 91 mL/min/{1.73_m2} (ref >=60)
Globulin: 2.4 g/dL (calc) (ref 1.9–3.7)
Glucose, Bld: 111 mg/dL — ABNORMAL HIGH (ref 65–99)
Potassium: 4.9 mmol/L (ref 3.5–5.3)
Sodium: 143 mmol/L (ref 135–146)
Total Bilirubin: 0.5 mg/dL (ref 0.2–1.2)
Total Protein: 6.8 g/dL (ref 6.1–8.1)

## 2020-02-05 LAB — HEMOGLOBIN A1C
Hgb A1c MFr Bld: 5.6 % of total Hgb (ref <5.7)
Mean Plasma Glucose: 114 (calc)
eAG (mmol/L): 6.3 (calc)

## 2020-02-05 LAB — MICROALBUMIN / CREATININE URINE RATIO
Creatinine, Urine: 180 mg/dL (ref 20–275)
Microalb Creat Ratio: 6 mcg/mg creat (ref <30)
Microalb, Ur: 1.1 mg/dL

## 2020-02-05 LAB — TSH: TSH: 1.42 mIU/L (ref 0.40–4.50)

## 2020-02-08 ENCOUNTER — Other Ambulatory Visit: Payer: Self-pay

## 2020-02-08 ENCOUNTER — Encounter: Payer: Self-pay | Admitting: Internal Medicine

## 2020-02-08 ENCOUNTER — Ambulatory Visit (INDEPENDENT_AMBULATORY_CARE_PROVIDER_SITE_OTHER): Payer: BC Managed Care – PPO | Admitting: Internal Medicine

## 2020-02-08 VITALS — BP 120/80 | HR 84 | Ht 66.25 in | Wt 186.0 lb

## 2020-02-08 DIAGNOSIS — I10 Essential (primary) hypertension: Secondary | ICD-10-CM | POA: Diagnosis not present

## 2020-02-08 DIAGNOSIS — G2581 Restless legs syndrome: Secondary | ICD-10-CM

## 2020-02-08 DIAGNOSIS — Z0000 Encounter for general adult medical examination without abnormal findings: Secondary | ICD-10-CM | POA: Diagnosis not present

## 2020-02-08 DIAGNOSIS — Z6829 Body mass index (BMI) 29.0-29.9, adult: Secondary | ICD-10-CM

## 2020-02-08 DIAGNOSIS — F419 Anxiety disorder, unspecified: Secondary | ICD-10-CM

## 2020-02-08 DIAGNOSIS — R059 Cough, unspecified: Secondary | ICD-10-CM

## 2020-02-08 DIAGNOSIS — R7302 Impaired glucose tolerance (oral): Secondary | ICD-10-CM

## 2020-02-08 DIAGNOSIS — E782 Mixed hyperlipidemia: Secondary | ICD-10-CM

## 2020-02-08 DIAGNOSIS — Z8659 Personal history of other mental and behavioral disorders: Secondary | ICD-10-CM

## 2020-02-08 DIAGNOSIS — F32A Depression, unspecified: Secondary | ICD-10-CM

## 2020-02-08 DIAGNOSIS — E8881 Metabolic syndrome: Secondary | ICD-10-CM

## 2020-02-08 LAB — POCT URINALYSIS DIPSTICK
Appearance: NEGATIVE
Bilirubin, UA: NEGATIVE
Blood, UA: NEGATIVE
Glucose, UA: NEGATIVE
Ketones, UA: NEGATIVE
Leukocytes, UA: NEGATIVE
Nitrite, UA: NEGATIVE
Odor: NEGATIVE
Protein, UA: NEGATIVE
Spec Grav, UA: 1.01 (ref 1.010–1.025)
Urobilinogen, UA: 0.2 E.U./dL
pH, UA: 6.5 (ref 5.0–8.0)

## 2020-02-08 NOTE — Progress Notes (Signed)
Subjective:    Patient ID: Crystal Blake, female    DOB: December 09, 1955, 64 y.o.   MRN: [id number]  HPI  64 year old Female seen for health maintenance exam and evaluation of medical issues. Patient is concerned there has been some adverse environmental exposure in her home.  Apparently, her husband and she hired a Chief Strategy Officer to do some work in International aid/development worker regarding heating and cooling system, and they feel that the work was done incorrectly.  A consultant has been to their home, and she has a report with her today. She says she was told her home was not safe to live in.  She is asking where  she should go for evaluation and consultation regarding this matter.   She has a history of attention deficit disorder treated by Dr.Kaur, psychiatrist, with Adderall 30 mg twice daily.  Trintellix previously prescribed by psychiatrist and we refilled that in February at her request.  History of restless leg syndrome treated with Requip.  History of impaired glucose tolerance treated with Glucophage XR 500 mg daily.  Takes low-dose thyroid replacement 25 mcg daily. This was started by Dr. Toy Care several years ago. History of hypertension treated with atenolol 50 mg daily.  Has taken Adderall 30 mg twice daily for attention deficit disorder in the past.  History of restless leg syndrome treated with Requip 1 mg daily.  In 2020, was taking Xanax 1 mg as needed but has had no recent refill on that as I can see.  Dr. Philis Pique is GYN physician.  Saw Dr. Philis Pique in February and had GYN exam and mammogram.  Says Crestor caused intestinal issues.Discussed options such as increasing current statin. Patient feels exercise would help but has intense fatigue. Takes hours for her to get out of bed now, she says.  Labs reviewed and showed triglycerides elevated at 256 with normal cholesterol of 195 and near normal LDL of 101.  CBC is within normal limits including differential.  She has questions about different types of white  blood cells in the blood and what normal range is.  Vitamin D level is normal at 37 and a year ago was low at 25.  History of impaired glucose tolerance with hemoglobin A1c over the past 2 years 5.7-5.8 but is now normal at 5.6.  Fasting glucose is slightly elevated at 111 as normal being up to 99.  Patient attributes improvement in glucose control to trying to eat better and getting some exercise.  There is mild elevation of 1 liver function ( SGPT) at 38 but that is improved over previous reading a year ago at 24.  History of left ureteral stone in 2018 measuring 6 x 4 mm with mild hydronephrosis.  History of osteotomy right fifth metatarsal by Dr. Paulla Dolly in 2017.  Had rhinoplasty 1978, vaginal hysterectomy 2002.  History of dermatitis which may be possible psoriasis affecting multiple parts of her body and has a small lesion under her left breast today.  History of hepatic steatosis on CT done in 2018 and this is the likely reason for her mild elevation in SGPT.  Patient brings with her today a piece of metal that she says was part of, as I understand it,  the heating/cooling system in her attic.  She feels that her home has been contaminated due to some heating and air conditioning replacement work that they had done on their home..  She brings with her an environmental evaluation that was done recently.  She wants to  know where she can go for help in this matter.  She says her husband has similar concerns as well.  Notes from November 2020 indicate patient was changed from Lexapro to Trintellix by Dr.Kaur and Dr. Toy Care put her on low-dose thyroid replacement medication.  Records from 02/04/2019 indicate patient was concerned  that she has been breathing contaminated air in her home for 3 years due to concern with heating and air system.  She did not want to go to an allergist.  Stated she had some dust exposure in her home.  Social history: She is married.  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  Currently not working.  She has 2 adult daughters living in [place].  She does not smoke.  Social alcohol consumption.  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  She has also worked as an Management consultant.  Family history: Father died with liver cancer.  Mother deceased with history of hypertension, hyperlipidemia and alcoholism.  There is family history of breast cancer in paternal grandmother at age 81 and paternal aunt at age 45.  Bilateral breast MRI from  August 2020 showed heterogeneous fibroglandular tissue and no evidence of abnormal mass or enhancement in the left breast.  The initial MRI in June 2020 apparently showed artifacts that were suspicious and repeat study was done according to radiology reports. Repeat study was negative.  In the past, ability to exercise was limited by some chronic low back pain.  It was noted in 2017 she had gained 9 pounds since 2015 due to lack of exercise as a result of back pain.   She has been a patient in this practice since 1987.  Colonoscopy done in 2017 with 3 polyps being removed from the transverse colon that were tubular and adenomatous according to pathology report.  History of lumbar back pain due to lumbar spinal stenosis and severe facet arthropathy and anteriolisthesis.  Patient has longstanding history of recurrent low back pain that started in 2015 when she was working on family beach house.  Was treated with oral steroids and went to physical therapy from May 18 through September 16, 2013.  She  went to [place] physical therapy and had some 7 treatments.  She is a former smoker and quit in the late 1980s.  Had suicidal gesture at 64 years of age.  History of migraine headaches.  History of allergic rhinitis.  Had a benign cyst removed from her left upper arm in 1972.  Pregnancy terminated 1979.  Severe depression 1980.   Review of Systems  loss  of energy over past few months. Prior to that was doing yard work. Issues with lack of energy- difficult to get her day started. Awakens at night coughing.     Objective:   Physical Exam Weight 186 pounds and was 194 pounds last year.  BMI 29.80, BP 120/80 pulse 84 Pulse ox 96%  Skin: Has an area under her left breast and appears to be raw and irritated.  Patient says it is her usual skin condition.  No cervical adenopathy.  No thyromegaly.  No JVD.  No carotid bruits.  Chest clear to auscultation without rales or wheezing.  Breast without masses.  Cardiac exam regular rate and rhythm normal S1 and S2.  Abdomen soft nondistended without hepatosplenomegaly masses or tenderness.  Pelvic exam deferred to GYN physician.  No lower extremity pitting  edema.  Neuro: Intact without gross focal deficits.      Assessment & Plan:  History of impaired glucose tolerance but hemoglobin A1c is normal.  Patient reports she is walking more and watching her diet.  Hyperlipidemia-is on statin medication.  Triglycerides are elevated at 256 and were 276 in February of this year and 329 in November of last year.  This represents improvement.  Continue with diet and exercise efforts.Intolerant of Crestor.  Essential hypertension-stable on current regimen  History of dermatitis  with small lesion under left breast.  Has used Kenalog cream in the past.  History of attention deficit disorder  History of depression- treated by Dr. Toy Care, psychiatrist. Is on Trintellix.  Fatigue- patient is having difficulty finding energy to get out of bed and start her day. TSH is normal. Dr. Toy Care  has her on low dose thyroid replacement as well as Trintellix. Hx Attention Deficit treated with Adderall which might help with fatigue.  History of low back pain- onset 2015  History of left ureteral stone in 2018  History of hepatic steatosis which can cause mild elevation of liver functions  History of adenomatous colon  polyps  BMI 29.80 (186 pounds)- improved from last year when it was 31.08 (194 pounds)  Patient reports hazardous environmental exposure in her home- have contacted [employer] and Mr. Talmadge Coventry has e-mailed information which I will send to patient by e-mail. She has researched this and has questions about her white blood cell differential as it relates to her environment. I told her these values all fall within normal limits and I cannot conclude any toxic exposure from her WBC or her differential.  I would suggest Pulmonary evaluation due to cough. [facility] will be happy to see patient.She may benefit from allergy testing as well.

## 2020-02-11 ENCOUNTER — Encounter (INDEPENDENT_AMBULATORY_CARE_PROVIDER_SITE_OTHER): Payer: Self-pay

## 2020-02-19 ENCOUNTER — Encounter: Payer: Self-pay | Admitting: Internal Medicine

## 2020-02-19 NOTE — Patient Instructions (Signed)
I am e-mailing material suggested by [employer] expert, Mr. Talmadge Coventry at [facility]. [facility] will be glad to evaluate your respiratory issues.

## 2020-02-29 ENCOUNTER — Telehealth: Payer: Self-pay | Admitting: Internal Medicine

## 2020-02-29 NOTE — Telephone Encounter (Signed)
Dr Renold Genta sent Email to [email or web address] on 02/16/2020 at 5:11pm with attachments -- email and contents is below   (There was also 5 attachments of information that Mr Talmadge Coventry attached that I have sent to be attached to the chart under media.) 1. Management consultant and Tune up Check List 2. Inspection Checklist for Dampness and Mold Growth 3. JACI mold inspection-interpretation -- Guide for interpreting reports from inspections/ investigating of indoor mold 4. Bigger Is Not Better: Engineer, drilling Properly 5. Testing for Molds    From: Emeline General  Sent: Wednesday, February 16, 2020 5:11 PM To: [email or web address] Subject: FW: dampness and mold growth-SECURE  Hi Giuliana,  After your visit, I reached out to a [facility] contact of mine who works with  occupational exposures, Dr. Alm Bustard. I spoke with Nutritional therapist, Doristine Locks, today by phone, and he has been gracious enough to  forward this information.  Also, [facility] may be a good place to start with an evaluation, so with your permission, I can refer both of you there. Just let Mariann Laster know next week if you and  Rush Landmark would like to pursue evaluation with [facility].  MJB  From: Doristine Locks [email or web address]>  Sent: Wednesday, February 16, 2020 1:28 PM To: Emeline General [email or web address]> Subject: dampness and mold growth  This message was sent securely using Zix    *Caution - External email - see footer for warnings* Dear Dr. Renold Genta:  I spoke with Alm Bustard and he should be catching up with you.    We typically see mold growth in air-conditioning systems as a summer time problem.  Mold spores are everywhere.  To grow a viable spore must land in a place with a food source and adequate moisture.  Air conditioning systems in the cooling mode are the perfect place for microbial growth.  Unless the owner is using medium efficiency pleated filters with  a Minimum Efficiency Value Rating of 8 or better, large particles will accumulate in the system.  When cooling is operating, both sensible heat and latent heat is removed.  Latent heat is the energy consumed that is needed to lower the incoming air below its dew point to cause condensation.  So the indoor coil becomes wet, water should drip into a pan and drain away.  Clogged condensate drain pans are one of the frequent complaints I get during the summer.    Both the supply side air duct (blowing) and the return side air ducts (suction) need to be tightly sealed, especially is the ducts run through normally unoccupied spaces.  Obviously leaks on the return side allow dirty air to enter the system is a major problem.  Leaks on the supply side divert air away from cooling the home, which is energy inefficient and may overcool other materials causing condensation.  Many modern systems use flexible ducts.  Flex ducts are have a plastic film on the interior that has high tensile strength, dimensional stability, chemical resistance and is vapor resistant) an insulation sandwich and a mylar exterior.  A helical wire inside the system helps to keep the duct to remain round.  They can provide good performance.  The should be straight and gave gentle bends to reduce resistance to air flow.  There will still be places that need sealing like joints between pieces of ducts and transitions like boots where the duct transitions to a metal diffuser or connects with the mechanical equipment.    One of the  challenges for mechanical cooling is "sizing" the equipment.  There is a manual from [employer] that I can provide.  The designer wants the equipment to be able to cool the house when under the maximum load (outdoor temperature, solar loads, people< and things) .  But the maximum loads don't occur that often, perhaps 5% of the time.  The designer wants the cooling system to operate long enough  at partial load to dehumidify the space.  In an oversized system the equipment runs long enough to lower the temperature to the set point, but does not run long enough to circulate air and to dehumidify air.   The same thing can happen when the set point temperature is to high and here is a personal story.  When I was a bachelor I set the thermostat for cooling at [place].  The equipment did not operate often or for long periods.  In august I started to detect a moldy odor when the system switched on.  About that time I had to repair my indoor unit for another reason.  What we found was a huge patch of mold growth on the interior insulation just downstream of  the cooling coil.  The dirt, dust and debris were the food sources.  The discharge air was cool but also had high relative humidity so the was available water for mold growth.    One solution was a properly sized indoor unit,  were to size the equipment properly.  In the heat of the summer my unit runs about 45 minutes in an hour.  In addition, there is no interior insulation in the equipment cabinet or the duct system.  Finally I has always used MERV 13 or allergy type filters.  An equal payment plan from [employer] helps so that I don't have a huge bill in the summer.    When I had to replace this system while the new one was being installed I took apart the old one.  Of course there was some rust and copper patina.  But the indoor coils were really clean and no signs of visible mold growth.    So I have attached an HVAC checklist and  paper from home energy magazine about sizing residential air conditioners.  I've also attached an inspection checklist for dampness and mold.  For guidance on air duct cleaning the EPA manual should you have your home air ducts cleaned can be found at   http://www.nolan.info/.  When it comes to environmental testing for molds I take a hard line.  Most mold  specialists who do air or surface testing are practicing junk science.  I've attached my ideas about why mold testing is not very helpful.  I've also attached a paper from the Journal of Allergy and Clinical Immunology called Guide for interpreting reports from inspections/ investigations of indoor mold.  Just identifying the types of molds and the presence of growth does not really advantage to client.    I hope this material is helpful.  I'd be happy to discuss these issues with your patients and please contact me if this is confusing or you need additional information      Doristine Locks,  [place]       [place] Department of Health and [facility] Occupational and Environmental Epidemiology [address], [place], [place]. [place], [place], [place] [address] Office:  [phone] Direct:   [phone] Fax:        [phone]  [phone]) [id number] [email or web address]    Email correspondence to and from this address is subject to the [employer] and may be disclosed to third parties by an authorized [employer] Unauthorized disclosure of juvenile, health, legally privileged, or otherwise confidential information, including confidential information relating to an ongoing Press photographer effort, is prohibited by Sports coach. If you have received this e-mail in error, please notify the sender immediately and delete all records of this e-mail.  WARNING: This email originated outside of [facility]. Even if this looks like a [employer], it is not. Do not provide your username, password, or any other personal information in response to this or any other email. [place] will never ask you for your username or password via email. DO NOT CLICK links or attachments unless you are positive the content is safe. If in doubt about the safety of this message, select the Cofense Report Phishing button,  which forwards to IT Security.

## 2020-03-16 ENCOUNTER — Telehealth: Payer: Self-pay | Admitting: Internal Medicine

## 2020-03-16 NOTE — Telephone Encounter (Signed)
Patient copied Dr Renold Genta on Email sent out to   Crystal Blake, [facility]  [place] Department of Health and [facility] Occupational and Environmental Epidemiology [address], [place], [place]. [place], [place], [place] [address] Office: [phone] Direct: [phone] Fax: [phone] Crystal Blake [email or web address]   with 2 attachments that I have sent to be scanned into chart.  From: Crystal Blake [email or web address]>  Sent: Wednesday, March 15, 2020 3:04 AM To: Crystal Blake Cc: Crystal [email or web address]> Subject: Question from patient of Crystal Blake, M.D.  *Caution - External email - see footer for warnings* Dear Crystal Blake:  Crystal Blake, M.D., forwarded your letter from Feb 16, 2020 regarding  "dampness and mold growth" to [place]. Thank you for including in that letter your willingness to discuss issues we have been experiencing. In Aug 2018 I discovered a major problem in our attic that prompted me to do a lot of research on HVAC systems. The information in your letter is spot on from what I learned. This gives me hope that you have the expertise to offer possible explanations of how this problem may have impacted our indoor air quality during the 2 1/2 year-period that we were unaware of the condition.  My discovery was three years before the Aug 2021 mold assessment we had done by an independent, 3rd party [place] that meets the criteria laid out in the mold testing document you sent. Therefore, this particular inquiry only focuses on the period from February 2016 to August 2018. The key sentence from your letter is:  "Leaks on the supply side divert air away from cooling the home, which is energy inefficient and may overcool other materials causing condensation."  SITUATION Prior to February 2016, our kitchen vent was located at the  top of a wall. The supply air was fed from a metal supply distribution box in the attic above the furnace in our laundry room where a long, round aluminum duct was connected. The other end of that duct had an elbow that joined the duct to a standard register boot, which was connected to a 14" x 14" x 3" saddle that fit into a custom wooden pocket in the attic floor that led down to the wall register. Attached is a diagram that shows the basic configuration of the duct and its components.  In August 2018, I discovered this supply duct had been pulled out of the pocket and left open in the foam attic insulation sitting on the joists. The saddle was covered with insulation but I could clearly see cold air forcefully blowing against the wall of my daughter's upstairs dormer bedroom. Attached is a diagram of that discovery.  Using heavy foil duct tape, I began taping up the end of the saddle, which became disconnected from the register boot after about five minutes. I then taped up the opening in the register boot thoroughly, then felt along the metal duct to tape over small leaks I felt after closing up the opening.  We did not have the money to bring in an Company secretary so we had to rely on my taping job, which I have checked each year to make sure it's still secure. During my research I learned there have surely been friction issues that have compromised the efficiency and energy costs of running the unit, but nothing that explains what can happen to the air in a home's living space if a duct with such a large  opening is left open for such a long period of time.  We have also had water issues in our attic from exterior siding that disintegrated. We believe this may have started happening in 2018 or 2019. For now, however, I would like to know anything you might be able to tell us about what can happen when a supply duct in what should have been a closed system is left open for 2 1/2 years in a filthy attic.  The 14" x 14" x 3" opening would be more than enough room to allow access to various types of vermin we know exist in and around our house. The air in this unfinished attic is heavy with particulate matter, and we don't know if the foam insulation in the attic of our 1941 home has asbestos.   Can you offer insight as to what may have occurred to our HVAC system and indoor air quality during that 2 1/2-year period from Feb 2016 to Aug 2018?  Thank you,  Crystal Blake

## 2020-03-28 ENCOUNTER — Other Ambulatory Visit: Payer: Self-pay | Admitting: Internal Medicine

## 2020-04-06 ENCOUNTER — Other Ambulatory Visit: Payer: Self-pay

## 2020-04-06 ENCOUNTER — Ambulatory Visit: Payer: BC Managed Care – PPO | Admitting: Podiatry

## 2020-04-06 ENCOUNTER — Encounter: Payer: Self-pay | Admitting: Podiatry

## 2020-04-06 ENCOUNTER — Ambulatory Visit (INDEPENDENT_AMBULATORY_CARE_PROVIDER_SITE_OTHER): Payer: BC Managed Care – PPO

## 2020-04-06 DIAGNOSIS — M205X9 Other deformities of toe(s) (acquired), unspecified foot: Secondary | ICD-10-CM

## 2020-04-06 DIAGNOSIS — M79671 Pain in right foot: Secondary | ICD-10-CM

## 2020-04-06 DIAGNOSIS — M779 Enthesopathy, unspecified: Secondary | ICD-10-CM | POA: Diagnosis not present

## 2020-04-06 DIAGNOSIS — M79672 Pain in left foot: Secondary | ICD-10-CM

## 2020-04-06 MED ORDER — TRIAMCINOLONE ACETONIDE 10 MG/ML IJ SUSP
10.0000 mg | Freq: Once | INTRAMUSCULAR | Status: AC
  Administered 2020-04-06: 10 mg

## 2020-04-06 MED ORDER — TRIAMCINOLONE ACETONIDE 10 MG/ML IJ SUSP: 10.0000 mg | Freq: Once | INTRAMUSCULAR | Status: AC

## 2020-04-10 NOTE — Progress Notes (Signed)
Subjective:   Patient ID: Crystal Blake, female   DOB: 65 y.o.   MRN: [id number]   HPI Patient presents with pain in both feet that is nondescript with history of being exposed to mold with inflammation mostly around the first MPJ bilateral that is quite painful with moderate swelling.  Patient does not smoke and is not active   Review of Systems  All other systems reviewed and are negative.       Objective:  Physical Exam Vitals and nursing note reviewed.  Constitutional:      Appearance: She is well-developed and well-nourished.  Cardiovascular:     Pulses: Intact distal pulses.  Pulmonary:     Effort: Pulmonary effort is normal.  Musculoskeletal:        General: Normal range of motion.  Skin:    General: Skin is warm.  Neurological:     Mental Status: She is alert.     Neurovascular status intact muscle strength was found to be adequate range of motion was adequate.  Patient is found to have discomfort mostly around the first MPJ bilateral with fluid buildup reduced motion around this particular joint and has moderate nondescript foot pain that is dispersed.  Patient has good digital perfusion is well oriented x3     Assessment:  Cannot rule out that there may not be a systemic inflammatory condition versus localized condition here with hallux limitus capsulitis first MPJ bilateral     Plan:  H&P x-rays reviewed and today I did sterile prep and injected around the first MPJ bilateral 3 mg dexamethasone Kenalog 5 mg Xylocaine advised on anti-inflammatories and we will get blood work to try to rule out any form of systemic disease  X-rays indicate that there is spurring around the first MPJ right over left with moderate diminishment of the joint space

## 2020-05-04 ENCOUNTER — Ambulatory Visit: Payer: BC Managed Care – PPO | Admitting: Podiatry

## 2020-05-04 ENCOUNTER — Other Ambulatory Visit: Payer: Self-pay

## 2020-05-04 ENCOUNTER — Encounter: Payer: Self-pay | Admitting: Podiatry

## 2020-05-04 DIAGNOSIS — M205X9 Other deformities of toe(s) (acquired), unspecified foot: Secondary | ICD-10-CM | POA: Diagnosis not present

## 2020-05-04 DIAGNOSIS — M779 Enthesopathy, unspecified: Secondary | ICD-10-CM | POA: Diagnosis not present

## 2020-05-04 MED ORDER — TRAMADOL HCL 50 MG PO TABS: 50.0000 mg | ORAL_TABLET | Freq: Two times a day (BID) | ORAL | 2 refills | Status: DC

## 2020-05-05 NOTE — Progress Notes (Signed)
Subjective:   Patient ID: Crystal Blake, female   DOB: 65 y.o.   MRN: [id number]   HPI Patient presents stating that what you did last time did help somewhat around the big toe joints for a period of time but she still is having a lot of pain in her feet and she still is not able to ambulate properly   ROS      Objective:  Physical Exam  Neurovascular status unchanged with patient found to have continued inflammation around the first MPJs of both feet and generalized foot pain bilateral with some discoloration of the skin with the possibility that there may be a vascular component to the discomfort that she is experiences with sensitivity increased     Assessment:  Patient does have hallux limitus deformity inflammatory changes first MPJ bilateral with possibility for some form of vascular insult for possible Raynaud's phenomena     Plan:  H&P condition reviewed and at this point I did discuss soaks anti-inflammatories as she can even though it is difficult for her to take and the possibility for future injections to try to reduce inflammation but do not recommend surgery until her health issues have been worked on and resolved.  Patient is encouraged to come back as needed but at this point we will hold off more aggressive treatment plan

## 2020-05-08 ENCOUNTER — Telehealth: Payer: Self-pay | Admitting: Podiatry

## 2020-05-08 NOTE — Telephone Encounter (Signed)
Patient calling to request medication refill traMADol (ULTRAM) 50 MG tablet  for foot pain

## 2020-05-09 ENCOUNTER — Telehealth: Payer: Self-pay | Admitting: *Deleted

## 2020-05-09 NOTE — Telephone Encounter (Signed)
Patient's husband is calling for the status of a medication(tramadol-50 mg) that was supposed to be sent to pharmacy 1 day ago and call them , is not there. She is leaving to go out of the state today and is giving husband a verbal  authorization to take handle. Please call at [phone]

## 2020-05-10 ENCOUNTER — Encounter: Payer: Self-pay | Admitting: Internal Medicine

## 2020-05-10 ENCOUNTER — Other Ambulatory Visit: Payer: Self-pay | Admitting: Podiatry

## 2020-05-10 NOTE — Telephone Encounter (Signed)
Called [facility] and they do not have a prescription there for Tramadol-50mg  w/ 2 refills. Please advise.

## 2020-05-10 NOTE — Telephone Encounter (Signed)
I sent her prescription for tramadol last week that had 2 refills. Not sure what is her concern

## 2020-05-10 NOTE — Telephone Encounter (Signed)
Was sent to the [facility] on [address] on the 10th

## 2020-05-11 ENCOUNTER — Telehealth: Payer: Self-pay | Admitting: Internal Medicine

## 2020-05-11 NOTE — Telephone Encounter (Signed)
Dr Renold Genta received this e-mail in her work e-mail with an unsecured attachment of graphs of their combined labs.   The attachment is being sent to scan center to be attached to the media section in the chart.  From: [facility] General  Sent: Thursday, May 11, 2020 9:16 AM To: Redonna Winona Legato [email or web address]> Subject: RE: Patient responsibility SECURE  We will be happy to refer you to a Hematologist to review. Please let Mariann Laster Know.  From: Saima Winona Legato [email or web address]>  Sent: Wednesday, May 10, 2020 6:47 PM To: Emeline General [email or web address]> Subject: Patient responsibility  *Caution - External email - see footer for warnings* Rush Landmark said he spoke with you today. Am I correct that you indicated the attached info is no cause for concern? I need to have surgery and have to make sure it won't impact my recovery.  Loretha Germain Osgood: This email originated outside of [facility]. Even if this looks like a [employer], it is not. Do not provide your username, password, or any other personal information in response to this or any other email. [place] will never ask you for your username or password via email. DO NOT CLICK links or attachments unless you are positive the content is safe. If in doubt about the safety of this message, select the Cofense Report Phishing button, which forwards to IT Security.

## 2020-05-15 ENCOUNTER — Telehealth: Payer: Self-pay | Admitting: Hematology and Oncology

## 2020-05-15 NOTE — Telephone Encounter (Signed)
Received a new hem referral from Dr. Renold Genta for concerns about white blood count and differential. Pt has been cld and scheduled to see Dr. Chryl Heck on 3/7 at 1040am. Pt aware to arrive 20 minutes.

## 2020-05-26 DIAGNOSIS — Z6829 Body mass index (BMI) 29.0-29.9, adult: Secondary | ICD-10-CM | POA: Diagnosis not present

## 2020-05-26 DIAGNOSIS — Z1231 Encounter for screening mammogram for malignant neoplasm of breast: Secondary | ICD-10-CM | POA: Diagnosis not present

## 2020-05-26 DIAGNOSIS — Z01419 Encounter for gynecological examination (general) (routine) without abnormal findings: Secondary | ICD-10-CM | POA: Diagnosis not present

## 2020-05-29 ENCOUNTER — Encounter: Payer: BC Managed Care – PPO | Admitting: Hematology and Oncology

## 2020-06-05 DIAGNOSIS — F410 Panic disorder [episodic paroxysmal anxiety] without agoraphobia: Secondary | ICD-10-CM | POA: Diagnosis not present

## 2020-06-05 DIAGNOSIS — F900 Attention-deficit hyperactivity disorder, predominantly inattentive type: Secondary | ICD-10-CM | POA: Diagnosis not present

## 2020-06-05 DIAGNOSIS — F4321 Adjustment disorder with depressed mood: Secondary | ICD-10-CM | POA: Diagnosis not present

## 2020-06-05 DIAGNOSIS — F3342 Major depressive disorder, recurrent, in full remission: Secondary | ICD-10-CM | POA: Diagnosis not present

## 2020-06-29 DIAGNOSIS — R140 Abdominal distension (gaseous): Secondary | ICD-10-CM | POA: Diagnosis not present

## 2020-06-29 DIAGNOSIS — Z6827 Body mass index (BMI) 27.0-27.9, adult: Secondary | ICD-10-CM | POA: Diagnosis not present

## 2020-07-26 ENCOUNTER — Other Ambulatory Visit: Payer: Self-pay | Admitting: Internal Medicine

## 2020-07-26 DIAGNOSIS — G2581 Restless legs syndrome: Secondary | ICD-10-CM

## 2020-08-04 ENCOUNTER — Other Ambulatory Visit: Payer: BC Managed Care – PPO | Admitting: Internal Medicine

## 2020-08-08 ENCOUNTER — Ambulatory Visit: Payer: BC Managed Care – PPO | Admitting: Internal Medicine

## 2020-09-22 ENCOUNTER — Other Ambulatory Visit: Payer: Self-pay | Admitting: Internal Medicine

## 2020-09-28 DIAGNOSIS — I10 Essential (primary) hypertension: Secondary | ICD-10-CM | POA: Diagnosis not present

## 2020-09-28 DIAGNOSIS — E039 Hypothyroidism, unspecified: Secondary | ICD-10-CM | POA: Diagnosis not present

## 2020-09-28 DIAGNOSIS — F902 Attention-deficit hyperactivity disorder, combined type: Secondary | ICD-10-CM | POA: Diagnosis not present

## 2020-09-28 DIAGNOSIS — E7800 Pure hypercholesterolemia, unspecified: Secondary | ICD-10-CM | POA: Diagnosis not present

## 2020-10-23 DIAGNOSIS — I7300 Raynaud's syndrome without gangrene: Secondary | ICD-10-CM | POA: Diagnosis not present

## 2020-11-22 ENCOUNTER — Encounter: Payer: Self-pay | Admitting: Gastroenterology

## 2020-12-05 DIAGNOSIS — F3342 Major depressive disorder, recurrent, in full remission: Secondary | ICD-10-CM | POA: Diagnosis not present

## 2020-12-05 DIAGNOSIS — F900 Attention-deficit hyperactivity disorder, predominantly inattentive type: Secondary | ICD-10-CM | POA: Diagnosis not present

## 2020-12-05 DIAGNOSIS — F410 Panic disorder [episodic paroxysmal anxiety] without agoraphobia: Secondary | ICD-10-CM | POA: Diagnosis not present

## 2021-01-26 DIAGNOSIS — H40053 Ocular hypertension, bilateral: Secondary | ICD-10-CM | POA: Diagnosis not present

## 2021-04-05 DIAGNOSIS — H40053 Ocular hypertension, bilateral: Secondary | ICD-10-CM | POA: Diagnosis not present

## 2021-04-25 DIAGNOSIS — Z23 Encounter for immunization: Secondary | ICD-10-CM | POA: Diagnosis not present

## 2021-04-25 DIAGNOSIS — E559 Vitamin D deficiency, unspecified: Secondary | ICD-10-CM | POA: Diagnosis not present

## 2021-04-25 DIAGNOSIS — E039 Hypothyroidism, unspecified: Secondary | ICD-10-CM | POA: Diagnosis not present

## 2021-04-25 DIAGNOSIS — I10 Essential (primary) hypertension: Secondary | ICD-10-CM | POA: Diagnosis not present

## 2021-04-25 DIAGNOSIS — E7800 Pure hypercholesterolemia, unspecified: Secondary | ICD-10-CM | POA: Diagnosis not present

## 2021-05-01 DIAGNOSIS — I10 Essential (primary) hypertension: Secondary | ICD-10-CM | POA: Diagnosis not present

## 2021-05-01 DIAGNOSIS — E039 Hypothyroidism, unspecified: Secondary | ICD-10-CM | POA: Diagnosis not present

## 2021-05-01 DIAGNOSIS — E7800 Pure hypercholesterolemia, unspecified: Secondary | ICD-10-CM | POA: Diagnosis not present

## 2021-05-01 DIAGNOSIS — E559 Vitamin D deficiency, unspecified: Secondary | ICD-10-CM | POA: Diagnosis not present

## 2021-05-29 DIAGNOSIS — F3342 Major depressive disorder, recurrent, in full remission: Secondary | ICD-10-CM | POA: Diagnosis not present

## 2021-06-01 DIAGNOSIS — Z1231 Encounter for screening mammogram for malignant neoplasm of breast: Secondary | ICD-10-CM | POA: Diagnosis not present

## 2021-06-01 DIAGNOSIS — Z01419 Encounter for gynecological examination (general) (routine) without abnormal findings: Secondary | ICD-10-CM | POA: Diagnosis not present

## 2021-07-23 DIAGNOSIS — E7800 Pure hypercholesterolemia, unspecified: Secondary | ICD-10-CM | POA: Diagnosis not present

## 2021-07-23 DIAGNOSIS — I10 Essential (primary) hypertension: Secondary | ICD-10-CM | POA: Diagnosis not present

## 2021-07-26 DIAGNOSIS — E559 Vitamin D deficiency, unspecified: Secondary | ICD-10-CM | POA: Diagnosis not present

## 2021-07-26 DIAGNOSIS — E663 Overweight: Secondary | ICD-10-CM | POA: Diagnosis not present

## 2021-07-26 DIAGNOSIS — I10 Essential (primary) hypertension: Secondary | ICD-10-CM | POA: Diagnosis not present

## 2021-07-26 DIAGNOSIS — R7303 Prediabetes: Secondary | ICD-10-CM | POA: Diagnosis not present

## 2021-08-02 ENCOUNTER — Encounter: Payer: Self-pay | Admitting: Podiatry

## 2021-08-02 ENCOUNTER — Ambulatory Visit (INDEPENDENT_AMBULATORY_CARE_PROVIDER_SITE_OTHER): Payer: BC Managed Care – PPO

## 2021-08-02 ENCOUNTER — Ambulatory Visit: Payer: BC Managed Care – PPO | Admitting: Podiatry

## 2021-08-02 DIAGNOSIS — S99911A Unspecified injury of right ankle, initial encounter: Secondary | ICD-10-CM

## 2021-08-02 DIAGNOSIS — S82891A Other fracture of right lower leg, initial encounter for closed fracture: Secondary | ICD-10-CM

## 2021-08-03 NOTE — Progress Notes (Signed)
Subjective:  ? ?Patient ID: Crystal Blake, female   DOB: 66 y.o.   MRN: [id number]  ? ?HPI ?Patient states she turned her right ankle around 3 weeks ago and its been sore since.  States it is slightly better and the swelling is gone down and she has been wearing a boot but she wanted to get it checked.  She is in a shoe today and states she is able to wear shoe part-time and also is concerned about her left foot where she does have a moderate bunion deformity and possibility for irritated fixation ? ? ?ROS ? ? ?   ?Objective:  ?Physical Exam  ?Neurovascular status intact with discomfort of a moderate nature around the fibula right and into the midfoot with mild edema no pitting edema and no other bruising pattern noted.  Left foot shows a relative prominent medial dorsal eminence and possibility for pin irritation ? ?   ?Assessment:  ?Possibility for fracture of the right fibula or fifth metatarsal base along with structural changes left ? ?   ?Plan:  ?H&P today were focusing on her right ankle and I did do multiple views x-rays indicated a fracture of the fibula.  It is already showing signs of secondary healing and I do think it will be adequate at this position and we will just have to monitor and it still possible at 1 point in future surgery is necessary.  I did give her moderate stress did not note issues and I was able to invert and evert the foot without any pathology for her.  I did discuss surgery at this point she would like to try to let it heal and see how it does.  We also discussed the left foot which will evaluated next week and decide whether or not surgical intervention may be necessary ? ?X-rays indicate there is a fracture of the fibula with some movement of the distal portion but there is signs of secondary bone healing I think it to be relatively stable at this position and the mortise does look reasonably good ?   ? ? ?

## 2021-08-23 ENCOUNTER — Ambulatory Visit: Payer: BC Managed Care – PPO | Admitting: Podiatry

## 2021-08-23 ENCOUNTER — Ambulatory Visit (INDEPENDENT_AMBULATORY_CARE_PROVIDER_SITE_OTHER): Payer: BC Managed Care – PPO

## 2021-08-23 ENCOUNTER — Encounter: Payer: Self-pay | Admitting: Podiatry

## 2021-08-23 DIAGNOSIS — S82891A Other fracture of right lower leg, initial encounter for closed fracture: Secondary | ICD-10-CM | POA: Diagnosis not present

## 2021-08-26 NOTE — Progress Notes (Signed)
Subjective:   Patient ID: Crystal Blake, female   DOB: 66 y.o.   MRN: [id number]   HPI Patient presents stating that she is feeling better but she wants her ankle checked that there is still some swelling that occurs more towards the end of the day.  She is wearing regular shoes now   ROS      Objective:  Physical Exam  Vascular status intact with swelling on the lateral side of the right ankle improved with good range of motion no loss of muscle strength with discomfort only when pressed deeply into the tissue     Assessment:  Previous fracture of the fibula right which appears to be healing with patient having discomfort that is completely natural for this.  During the healing process     Plan:  H&P reviewed condition and recommended the continuation of ice therapy anti-inflammatories as needed and supportive shoes with hightops.  Patient will be seen back if symptoms persist may require bone stimulator if this does not heal but at this point I am optimistic it will heal uneventfully long-term  X-rays indicate that there is a fracture of the fibula right there is secondary bone healing and I do think that the ankle joint looks healthy and should not be a problem with no clinical indications of ankle instability

## 2021-10-04 DIAGNOSIS — H25013 Cortical age-related cataract, bilateral: Secondary | ICD-10-CM | POA: Diagnosis not present

## 2021-10-04 DIAGNOSIS — H40053 Ocular hypertension, bilateral: Secondary | ICD-10-CM | POA: Diagnosis not present

## 2021-10-24 DIAGNOSIS — R7303 Prediabetes: Secondary | ICD-10-CM | POA: Diagnosis not present

## 2021-10-24 DIAGNOSIS — E7800 Pure hypercholesterolemia, unspecified: Secondary | ICD-10-CM | POA: Diagnosis not present

## 2021-10-31 DIAGNOSIS — E559 Vitamin D deficiency, unspecified: Secondary | ICD-10-CM | POA: Diagnosis not present

## 2021-10-31 DIAGNOSIS — I10 Essential (primary) hypertension: Secondary | ICD-10-CM | POA: Diagnosis not present

## 2021-10-31 DIAGNOSIS — Z0000 Encounter for general adult medical examination without abnormal findings: Secondary | ICD-10-CM | POA: Diagnosis not present

## 2021-10-31 DIAGNOSIS — E7800 Pure hypercholesterolemia, unspecified: Secondary | ICD-10-CM | POA: Diagnosis not present

## 2021-10-31 DIAGNOSIS — Z23 Encounter for immunization: Secondary | ICD-10-CM | POA: Diagnosis not present

## 2021-11-08 DIAGNOSIS — H2513 Age-related nuclear cataract, bilateral: Secondary | ICD-10-CM | POA: Diagnosis not present

## 2021-11-20 DIAGNOSIS — M5030 Other cervical disc degeneration, unspecified cervical region: Secondary | ICD-10-CM | POA: Diagnosis not present

## 2021-11-22 DIAGNOSIS — F4321 Adjustment disorder with depressed mood: Secondary | ICD-10-CM | POA: Diagnosis not present

## 2021-11-22 DIAGNOSIS — F410 Panic disorder [episodic paroxysmal anxiety] without agoraphobia: Secondary | ICD-10-CM | POA: Diagnosis not present

## 2021-11-22 DIAGNOSIS — F900 Attention-deficit hyperactivity disorder, predominantly inattentive type: Secondary | ICD-10-CM | POA: Diagnosis not present

## 2021-11-28 DIAGNOSIS — H2511 Age-related nuclear cataract, right eye: Secondary | ICD-10-CM | POA: Diagnosis not present

## 2021-11-28 DIAGNOSIS — H269 Unspecified cataract: Secondary | ICD-10-CM | POA: Diagnosis not present

## 2021-12-07 DIAGNOSIS — M542 Cervicalgia: Secondary | ICD-10-CM | POA: Diagnosis not present

## 2021-12-10 DIAGNOSIS — M542 Cervicalgia: Secondary | ICD-10-CM | POA: Diagnosis not present

## 2021-12-17 ENCOUNTER — Encounter: Payer: Self-pay | Admitting: Podiatry

## 2021-12-17 DIAGNOSIS — M542 Cervicalgia: Secondary | ICD-10-CM | POA: Diagnosis not present

## 2021-12-19 ENCOUNTER — Ambulatory Visit: Payer: BC Managed Care – PPO | Admitting: Podiatry

## 2021-12-19 ENCOUNTER — Ambulatory Visit (INDEPENDENT_AMBULATORY_CARE_PROVIDER_SITE_OTHER): Payer: BC Managed Care – PPO

## 2021-12-19 ENCOUNTER — Encounter: Payer: Self-pay | Admitting: Podiatry

## 2021-12-19 DIAGNOSIS — M7751 Other enthesopathy of right foot: Secondary | ICD-10-CM

## 2021-12-19 DIAGNOSIS — M21621 Bunionette of right foot: Secondary | ICD-10-CM | POA: Diagnosis not present

## 2021-12-19 DIAGNOSIS — M2021 Hallux rigidus, right foot: Secondary | ICD-10-CM | POA: Diagnosis not present

## 2021-12-19 DIAGNOSIS — H269 Unspecified cataract: Secondary | ICD-10-CM | POA: Diagnosis not present

## 2021-12-19 DIAGNOSIS — M2041 Other hammer toe(s) (acquired), right foot: Secondary | ICD-10-CM

## 2021-12-19 DIAGNOSIS — M779 Enthesopathy, unspecified: Secondary | ICD-10-CM | POA: Diagnosis not present

## 2021-12-19 DIAGNOSIS — H2512 Age-related nuclear cataract, left eye: Secondary | ICD-10-CM | POA: Diagnosis not present

## 2021-12-20 ENCOUNTER — Telehealth: Payer: Self-pay

## 2021-12-20 ENCOUNTER — Other Ambulatory Visit: Payer: Self-pay | Admitting: Podiatry

## 2021-12-20 MED ORDER — TRAMADOL HCL 50 MG PO TABS: 50.0000 mg | ORAL_TABLET | Freq: Four times a day (QID) | ORAL | 0 refills | Status: AC

## 2021-12-20 MED ORDER — TRAMADOL HCL 50 MG PO TABS: 50.0000 mg | ORAL_TABLET | Freq: Two times a day (BID) | ORAL | 2 refills | Status: DC

## 2021-12-20 NOTE — Addendum Note (Signed)
Addended by: Wallene Huh on: 12/20/2021 01:36 PM   Modules accepted: Orders

## 2021-12-20 NOTE — Telephone Encounter (Signed)
DOS 01/01/2022  KELLER BUNION IMPLANT RT - 28291, METATARSAL OSTEOTOMY 5TH RT - 28308 HAMMERTOE REPAIR 2ND RT - 28285 EXOSTECTOMY 1ST RT - 28108  BCBS EFFECTIVE DATE - 03/25/2021  PLAN DEDUCTIBLE - $0.00 OUT OF POCKET - $1100.00 W/ $0.00 REMAINING COPAY $0.00 COINSURANCE - 30% PER SERVICE YEAR  NO PRECERT REQUIRED PER WEBSITE

## 2021-12-20 NOTE — Telephone Encounter (Signed)
I sent them in this am

## 2021-12-20 NOTE — Telephone Encounter (Signed)
Crystal Blake stated you were going to call her in Tramadol. Please advise

## 2021-12-20 NOTE — Progress Notes (Signed)
Subjective:   Patient ID: Crystal Blake, female   DOB: 66 y.o.   MRN: [id number]   HPI Patient presents stating she has had a lot of pain around the big toe joint right over left that is been very sore and she is getting pain between the hallux and second toe right and left right being worse and has prominent fifth metatarsal head bilateral that are painful.  States that this has been intensifying and that she would like to get something done with this and she did have surgery approximate 15 years ago.  Patient overall is doing okay is just had eye surgery   ROS      Objective:  Physical Exam  Neurovascular status intact muscle strength found to be adequate range of motion adequate.  Patient is noted to have significant loss of motion of the first MPJ bilateral with pain around the joint which is intensifying with keratotic area and redness on the hallux and second digit where the 2 toes are abutting against the proximal phalanx head.  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  She is losing her joint structure and at this time I have recommended joint replacement versus fusion and I did discuss both options and she is not interested in fusion.  I then discussed arthroplasty digit 2 exostectomy hallux and metatarsal osteotomy fifth right.  I allowed her to read consent form going over at great length the issues involved with surgery and she understands procedure and risk and understands total recovery can take 6 months and that implants at times have to be removed with fusion.  She wants surgery and after extensive review signed consent  form scheduled for outpatient surgery in today air fracture walker dispensed properly fitted with instructions to get used to it prior to the procedure so that she is comfortable when the surgery is done.  Patient scheduled and encouraged to call any questions concerns which may arise  X-rays indicate that there is joint motion loss with arthritis first MPJ pressure between the hallux second toe tailor's bunion deformity right

## 2021-12-24 DIAGNOSIS — M542 Cervicalgia: Secondary | ICD-10-CM | POA: Diagnosis not present

## 2021-12-31 ENCOUNTER — Telehealth: Payer: Self-pay | Admitting: Podiatry

## 2021-12-31 DIAGNOSIS — M542 Cervicalgia: Secondary | ICD-10-CM | POA: Diagnosis not present

## 2021-12-31 MED ORDER — OXYCODONE-ACETAMINOPHEN 10-325 MG PO TABS: 1.0000 | ORAL_TABLET | ORAL | 0 refills | Status: DC | PRN

## 2021-12-31 MED ORDER — ONDANSETRON HCL 4 MG PO TABS: 4.0000 mg | ORAL_TABLET | Freq: Three times a day (TID) | ORAL | 0 refills | Status: DC | PRN

## 2021-12-31 NOTE — Addendum Note (Signed)
Addended by: Wallene Huh on: 12/31/2021 05:10 PM   Modules accepted: Orders

## 2021-12-31 NOTE — Telephone Encounter (Signed)
Patients husband called stating the pharmacy was out of the percocet that was ordered. I have resent it to [facility] for her.

## 2022-01-01 ENCOUNTER — Encounter: Payer: Self-pay | Admitting: Podiatry

## 2022-01-01 DIAGNOSIS — M2021 Hallux rigidus, right foot: Secondary | ICD-10-CM | POA: Diagnosis not present

## 2022-01-01 DIAGNOSIS — M19071 Primary osteoarthritis, right ankle and foot: Secondary | ICD-10-CM | POA: Diagnosis not present

## 2022-01-01 DIAGNOSIS — M2011 Hallux valgus (acquired), right foot: Secondary | ICD-10-CM | POA: Diagnosis not present

## 2022-01-01 DIAGNOSIS — M21621 Bunionette of right foot: Secondary | ICD-10-CM | POA: Diagnosis not present

## 2022-01-07 ENCOUNTER — Telehealth: Payer: Self-pay | Admitting: Podiatry

## 2022-01-07 ENCOUNTER — Ambulatory Visit (INDEPENDENT_AMBULATORY_CARE_PROVIDER_SITE_OTHER): Payer: BC Managed Care – PPO

## 2022-01-07 ENCOUNTER — Encounter: Payer: Self-pay | Admitting: Podiatry

## 2022-01-07 ENCOUNTER — Ambulatory Visit (INDEPENDENT_AMBULATORY_CARE_PROVIDER_SITE_OTHER): Payer: BC Managed Care – PPO | Admitting: Podiatry

## 2022-01-07 DIAGNOSIS — Z9889 Other specified postprocedural states: Secondary | ICD-10-CM

## 2022-01-07 NOTE — Telephone Encounter (Signed)
Called and spoke to Priscille Loveless with [employer] regarding a Retro Prior Authorization for surgery patient had on 01/01/2022. Rose stated that it was denied. Stated the reason of denial is because none of the notes showed where the patient met all of the criteria and that they did not deem the surgery as medically necessary. 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 Call reference number: [id number].

## 2022-01-09 NOTE — Progress Notes (Signed)
Subjective:   Patient ID: Crystal Blake, female   DOB: 66 y.o.   MRN: [id number]   HPI Patient states doing well with right foot and is ready to get her left foot done as soon as possible   ROS      Objective:  Physical Exam  Neurovascular status intact negative Bevelyn Buckles' sign was noted with patient's right foot healing well wound edges well coapted with incision sites healing well no active drainage noted     Assessment:  Doing well post forefoot reconstruction right     Plan:  H&P x-rays reviewed and went ahead today reapplied sterile dressing instructed on continued elevation compression immobilization reappoint to recheck again in approximate 2 weeks suture removal and left foot can be discussed at that time  X-rays indicate that there is excellent position of the implant of the first MPJ good alignment noted everything else

## 2022-01-14 DIAGNOSIS — M542 Cervicalgia: Secondary | ICD-10-CM | POA: Diagnosis not present

## 2022-01-15 ENCOUNTER — Encounter: Payer: Self-pay | Admitting: Podiatry

## 2022-01-15 ENCOUNTER — Telehealth: Payer: Self-pay | Admitting: Podiatry

## 2022-01-15 NOTE — Telephone Encounter (Signed)
Pt called and states she got a denial for the surgery and she states she will take care of it that you did not need to worry about it. She said only thing you need to look at  for the my chart message she sent is the attachment.

## 2022-01-16 DIAGNOSIS — N62 Hypertrophy of breast: Secondary | ICD-10-CM | POA: Diagnosis not present

## 2022-01-21 ENCOUNTER — Ambulatory Visit (INDEPENDENT_AMBULATORY_CARE_PROVIDER_SITE_OTHER): Payer: BC Managed Care – PPO

## 2022-01-21 ENCOUNTER — Telehealth: Payer: Self-pay

## 2022-01-21 DIAGNOSIS — Z9889 Other specified postprocedural states: Secondary | ICD-10-CM

## 2022-01-21 NOTE — Progress Notes (Signed)
POV #2 DOS 01/01/2022 KELLER BUNION IMPLANT RT, METATARSAL OSTEOTOMY 5TH RT, HAMMERTOE REPAIR 2NF RT, EXOSTECTOMY 1ST RT.  Patient in office today to have sutures removed from right 2nd digit. Sutures were removed from the right 2nd digit without complication. Patient tolerated suture removal well.   Advised patient to gradually work her way back into a supportive athletic type of shoe over the next week. Patient verbalized understanding. Dr. Paulla Dolly did approve for the patient to have a handicap plaque for her car.   Patient will schedule an appointment to discuss the left foot future surgery in a few weeks.

## 2022-01-21 NOTE — Telephone Encounter (Signed)
Patient Handicapp placement application has been mailed to the patients address.

## 2022-01-28 DIAGNOSIS — M542 Cervicalgia: Secondary | ICD-10-CM | POA: Diagnosis not present

## 2022-02-21 ENCOUNTER — Ambulatory Visit (INDEPENDENT_AMBULATORY_CARE_PROVIDER_SITE_OTHER): Payer: BC Managed Care – PPO | Admitting: Podiatry

## 2022-02-21 ENCOUNTER — Ambulatory Visit (INDEPENDENT_AMBULATORY_CARE_PROVIDER_SITE_OTHER): Payer: BC Managed Care – PPO

## 2022-02-21 DIAGNOSIS — M205X2 Other deformities of toe(s) (acquired), left foot: Secondary | ICD-10-CM | POA: Diagnosis not present

## 2022-02-21 DIAGNOSIS — Z9889 Other specified postprocedural states: Secondary | ICD-10-CM | POA: Diagnosis not present

## 2022-02-21 DIAGNOSIS — M79672 Pain in left foot: Secondary | ICD-10-CM | POA: Diagnosis not present

## 2022-02-21 DIAGNOSIS — M2021 Hallux rigidus, right foot: Secondary | ICD-10-CM

## 2022-02-22 ENCOUNTER — Encounter: Payer: Self-pay | Admitting: Podiatry

## 2022-02-22 ENCOUNTER — Telehealth: Payer: Self-pay | Admitting: Podiatry

## 2022-02-22 NOTE — Progress Notes (Signed)
Subjective:   Patient ID: Crystal Blake, female   DOB: 66 y.o.   MRN: [id number]   HPI Patient states doing very well with the right foot but the left big toe joint is killing her and she needs to have that 1 corrected like the right.  Patient had this problem for years and both feet states that she is try different shoe gear modification she is tried wider shoes she has tried soaking it and we have done previous injections without relief of symptoms   ROS      Objective:  Physical Exam  Neurovascular status intact negative Bevelyn Buckles' sign noted wound edges right healing very well good alignment first MPJ excellent range of motion no pain anymore in the joint surface with well-healing surgical site fifth metatarsal with the left foot showing severe loss of motion first MPJ with pain and bone spur formation and inability to walk with any degree of comfort     Assessment:  Doing well post surgery of the right foot with the left foot showing significant arthritis of the big toe joint and exquisite pain with failure to respond conservatively and has done beautifully with the right implant that was done last month     Plan:  H&P x-rays reviewed discussed at great length recommended implant arthroplasty first MPJ left due to chronic arthritis patient wants surgery and at this point after extensive review signed consent form.  Patient scheduled outpatient surgery left for the right reviewed x-rays patient may resume normal activity shoes on the right foot  X-rays indicate the left shows severe narrowing of the joint surface with multiple signs of arthritis of the joint spur formation and clinical no motion of the joint with pain right shows excellent implant arthroplasty excellent position fixation right fifth metatarsal good

## 2022-02-22 NOTE — Telephone Encounter (Signed)
DOS: 03/05/2022  BCBS Effective 03/25/2021  [employer] [phone])  Deductible: $0 Out-of-Pocket: $1,100 with $0 remaining CoInsurance: 0%  Pending [employer] Authorization #: [id number]  Will have decision by 02/27/2022 per [employer].

## 2022-03-04 MED ORDER — OXYCODONE-ACETAMINOPHEN 10-325 MG PO TABS: 1.0000 | ORAL_TABLET | Freq: Four times a day (QID) | ORAL | 0 refills | Status: DC | PRN

## 2022-03-04 NOTE — Addendum Note (Signed)
Addended by: Wallene Huh on: 03/04/2022 05:13 PM   Modules accepted: Orders

## 2022-03-05 ENCOUNTER — Encounter: Payer: Self-pay | Admitting: Podiatry

## 2022-03-05 DIAGNOSIS — M2022 Hallux rigidus, left foot: Secondary | ICD-10-CM | POA: Diagnosis not present

## 2022-03-05 DIAGNOSIS — M205X2 Other deformities of toe(s) (acquired), left foot: Secondary | ICD-10-CM | POA: Diagnosis not present

## 2022-03-11 ENCOUNTER — Ambulatory Visit (INDEPENDENT_AMBULATORY_CARE_PROVIDER_SITE_OTHER): Payer: BC Managed Care – PPO

## 2022-03-11 ENCOUNTER — Ambulatory Visit: Payer: BC Managed Care – PPO

## 2022-03-11 VITALS — BP 138/90 | HR 88

## 2022-03-11 DIAGNOSIS — M205X2 Other deformities of toe(s) (acquired), left foot: Secondary | ICD-10-CM

## 2022-03-11 NOTE — Progress Notes (Signed)
Patient presents today for post op visit # 1, patient of Dr Paulla Dolly.   POV # 1 DOS 03/05/22 Crystal Blake Implant    She presents in her walking boot wearbearing. Denies any falls or injury to the foot. Foot is swollen. No signs of infection. No calf pain or shortness of breath. Bandages dry and intact. Incision is intact.    BP: 138/90 P: 88    Xrays taken today and reviewed by Dr. Paulla Dolly.    Foot redressed today with compression anklet and placed her a surgical shoe today that patient has had from previous surgery. Reviewed icing and elevation. She will follow up with Dr. Paulla Dolly next week for POV# 2.

## 2022-03-20 ENCOUNTER — Encounter: Payer: Self-pay | Admitting: Podiatry

## 2022-03-22 ENCOUNTER — Other Ambulatory Visit: Payer: Self-pay | Admitting: Podiatry

## 2022-03-22 ENCOUNTER — Ambulatory Visit (INDEPENDENT_AMBULATORY_CARE_PROVIDER_SITE_OTHER): Payer: BC Managed Care – PPO | Admitting: Podiatry

## 2022-03-22 ENCOUNTER — Ambulatory Visit (INDEPENDENT_AMBULATORY_CARE_PROVIDER_SITE_OTHER): Payer: BC Managed Care – PPO

## 2022-03-22 VITALS — BP 167/117 | HR 113

## 2022-03-22 DIAGNOSIS — M205X2 Other deformities of toe(s) (acquired), left foot: Secondary | ICD-10-CM

## 2022-03-22 DIAGNOSIS — Z9889 Other specified postprocedural states: Secondary | ICD-10-CM

## 2022-03-22 MED ORDER — DOXYCYCLINE HYCLATE 100 MG PO TABS: 100.0000 mg | ORAL_TABLET | Freq: Two times a day (BID) | ORAL | 1 refills | Status: DC

## 2022-03-22 NOTE — Progress Notes (Signed)
Subjective:   Patient ID: Crystal Blake, female   DOB: 66 y.o.   MRN: [id number]   HPI Patient states she has had some redness over the last couple days in her left incision site and she has been quite active on her foot.  Blood pressure was high today but she states she did not take her medicine I have asked her to monitor at home and she has no systemic signs of infection she does not give any indication of fever or any other issues   ROS      Objective:  Physical Exam  Neurovascular status intact muscle strength adequate range of motion excellent first MPJ left redness on the proximal portion of the incision no active drainage noted currently no proximal edema erythema drainage noted     Assessment:  Possibility for low-grade cellulitis left foot as patient is been very active and may have gotten bacterial infiltration that does not appear systemic currently     Plan:  Reviewed with patient as precautionary measure placed on doxycycline twice daily discussed soaks elevation and reapplied sterile dressing today.  Patient will be seen back in 1 week strict instructions if any increased erythema edema or any systemic signs of infection to go to the hospital  X-rays indicate that the bone structure looks good the implant looks good I do not see pathology but I am good to watch her closely

## 2022-03-29 ENCOUNTER — Ambulatory Visit: Payer: BC Managed Care – PPO | Admitting: Podiatry

## 2022-03-29 DIAGNOSIS — M205X9 Other deformities of toe(s) (acquired), unspecified foot: Secondary | ICD-10-CM

## 2022-03-30 NOTE — Progress Notes (Signed)
Subjective:   Patient ID: Crystal Blake, female   DOB: 67 y.o.   MRN: [id number]   HPI Patient states doing fine states that the redness is gone away she does have some irritation on the outside of her foot from walking differently   ROS      Objective:  Physical Exam  Neuro vascular status intact patient's left first metatarsal looks much better there is no drainage no redness excellent range of motion healing well slight irritation of the tissue outside left secondary to gait     Assessment:  Patient is doing fine at this point  no indications of pathology noted     Plan:  Reviewed condition at this point patient can start to go back into shoes the lateral pain should resolve and I do not see any issues to worry about

## 2022-04-18 DIAGNOSIS — E039 Hypothyroidism, unspecified: Secondary | ICD-10-CM | POA: Diagnosis not present

## 2022-04-18 DIAGNOSIS — E559 Vitamin D deficiency, unspecified: Secondary | ICD-10-CM | POA: Diagnosis not present

## 2022-04-18 DIAGNOSIS — I10 Essential (primary) hypertension: Secondary | ICD-10-CM | POA: Diagnosis not present

## 2022-04-18 DIAGNOSIS — R7303 Prediabetes: Secondary | ICD-10-CM | POA: Diagnosis not present

## 2022-04-18 DIAGNOSIS — E7800 Pure hypercholesterolemia, unspecified: Secondary | ICD-10-CM | POA: Diagnosis not present

## 2022-04-25 DIAGNOSIS — E7800 Pure hypercholesterolemia, unspecified: Secondary | ICD-10-CM | POA: Diagnosis not present

## 2022-04-25 DIAGNOSIS — I10 Essential (primary) hypertension: Secondary | ICD-10-CM | POA: Diagnosis not present

## 2022-04-25 DIAGNOSIS — E559 Vitamin D deficiency, unspecified: Secondary | ICD-10-CM | POA: Diagnosis not present

## 2022-04-25 DIAGNOSIS — R7303 Prediabetes: Secondary | ICD-10-CM | POA: Diagnosis not present

## 2022-04-25 DIAGNOSIS — Z23 Encounter for immunization: Secondary | ICD-10-CM | POA: Diagnosis not present

## 2022-05-17 DIAGNOSIS — F410 Panic disorder [episodic paroxysmal anxiety] without agoraphobia: Secondary | ICD-10-CM | POA: Diagnosis not present

## 2022-05-17 DIAGNOSIS — F900 Attention-deficit hyperactivity disorder, predominantly inattentive type: Secondary | ICD-10-CM | POA: Diagnosis not present

## 2022-05-17 DIAGNOSIS — F3342 Major depressive disorder, recurrent, in full remission: Secondary | ICD-10-CM | POA: Diagnosis not present

## 2022-07-12 ENCOUNTER — Encounter: Payer: Self-pay | Admitting: Gastroenterology

## 2022-12-19 ENCOUNTER — Ambulatory Visit (AMBULATORY_SURGERY_CENTER): Payer: Medicare Other | Admitting: *Deleted

## 2022-12-19 VITALS — Ht 67.0 in | Wt 179.0 lb

## 2022-12-19 DIAGNOSIS — Z86010 Personal history of colonic polyps: Secondary | ICD-10-CM

## 2022-12-19 MED ORDER — NA SULFATE-K SULFATE-MG SULF 17.5-3.13-1.6 GM/177ML PO SOLN: 1.0000 | Freq: Once | ORAL | 0 refills | Status: AC

## 2022-12-19 NOTE — Progress Notes (Signed)
Virtual PV conducted over telephone. Instructions forwarded through MyChart and patient confirmed she received them. All questions answered to patient's satisfaction.  No egg or soy allergy known to patient  No issues known to pt with past sedation with any surgeries or procedures Patient denies ever being told they had issues or difficulty with intubation  No FH of Malignant Hyperthermia Pt is not on diet pills Pt is not on  home 02  Pt is not on blood thinners  Pt denies issues with constipation  No A fib or A flutter Have any cardiac testing pending--NO Pt instructed to use [email or web address] or GoodRx for a price reduction on prep

## 2023-01-09 ENCOUNTER — Ambulatory Visit (AMBULATORY_SURGERY_CENTER): Payer: Medicare Other | Admitting: Gastroenterology

## 2023-01-09 ENCOUNTER — Encounter: Payer: Self-pay | Admitting: Gastroenterology

## 2023-01-09 VITALS — BP 132/86 | HR 75 | Temp 97.9°F | Resp 13 | Ht 66.25 in | Wt 179.0 lb

## 2023-01-09 DIAGNOSIS — K635 Polyp of colon: Secondary | ICD-10-CM

## 2023-01-09 DIAGNOSIS — D124 Benign neoplasm of descending colon: Secondary | ICD-10-CM

## 2023-01-09 DIAGNOSIS — D122 Benign neoplasm of ascending colon: Secondary | ICD-10-CM

## 2023-01-09 DIAGNOSIS — Z09 Encounter for follow-up examination after completed treatment for conditions other than malignant neoplasm: Secondary | ICD-10-CM

## 2023-01-09 DIAGNOSIS — D123 Benign neoplasm of transverse colon: Secondary | ICD-10-CM

## 2023-01-09 DIAGNOSIS — Z860100 Personal history of colon polyps, unspecified: Secondary | ICD-10-CM

## 2023-01-09 MED ORDER — SODIUM CHLORIDE 0.9 % IV SOLN: 500.0000 mL | Freq: Once | INTRAVENOUS | Status: AC

## 2023-01-09 NOTE — Progress Notes (Signed)
Pt's states no medical or surgical changes since previsit or office visit. VS assessed by this Nurse.

## 2023-01-09 NOTE — Progress Notes (Signed)
Called to room to assist during endoscopic procedure.  Patient ID and intended procedure confirmed with present staff. Received instructions for my participation in the procedure from the performing physician.  

## 2023-01-09 NOTE — Progress Notes (Signed)
[facility] History and Physical   Primary Care Physician:  Merri Brunette, MD   Reason for Procedure:   History of colon polyps  Plan:    colonoscopy     HPI: Crystal Blake is a 67 y.o. female  here for colonoscopy surveillance - one adenoma removed 07/2015.   Patient denies any bowel symptoms at this time. No family history of colon cancer known. Otherwise feels well without any cardiopulmonary symptoms.   I have discussed risks / benefits of anesthesia and endoscopic procedure with Kayde Jackey Loge and they wish to proceed with the exams as outlined today.    Past Medical History:  Diagnosis Date   ADD (attention deficit disorder)    Allergy    Anxiety    Cataract    Depression    Hyperlipidemia    Hypertension    Mold exposure    Restless leg syndrome     Past Surgical History:  Procedure Laterality Date   ABDOMINAL HYSTERECTOMY     cyst removed from arm  1972   benign cyst    FOOT SURGERY  2007   RHINOPLASTY      Prior to Admission medications   Medication Sig Start Date End Date Taking? Authorizing Provider  ALPRAZolam Prudy Feeler) 1 MG tablet Take 1 mg by mouth 2 (two) times daily as needed for anxiety.   Yes [provider]  amphetamine-dextroamphetamine (ADDERALL) 30 MG tablet Take 30 mg by mouth daily.   Yes [provider]  atenolol (TENORMIN) 50 MG tablet TAKE 1 TABLET(50 MG) BY MOUTH DAILY 03/28/20  Yes Baxley, Luanna Cole, MD  atorvastatin (LIPITOR) 40 MG tablet TAKE 1 TABLET(40 MG) BY MOUTH DAILY 01/31/20  Yes Baxley, Luanna Cole, MD  escitalopram (LEXAPRO) 20 MG tablet Take 20 mg by mouth daily.   Yes [provider]  rOPINIRole (REQUIP) 1 MG tablet Take 1 tablet (1 mg total) by mouth daily. 08/19/19  Yes BaxleyLuanna Cole, MD    Current Outpatient Medications  Medication Sig Dispense Refill   ALPRAZolam (XANAX) 1 MG tablet Take 1 mg by mouth 2 (two) times daily as needed for anxiety.     amphetamine-dextroamphetamine  (ADDERALL) 30 MG tablet Take 30 mg by mouth daily.     atenolol (TENORMIN) 50 MG tablet TAKE 1 TABLET(50 MG) BY MOUTH DAILY 90 tablet 1   atorvastatin (LIPITOR) 40 MG tablet TAKE 1 TABLET(40 MG) BY MOUTH DAILY 90 tablet 1   escitalopram (LEXAPRO) 20 MG tablet Take 20 mg by mouth daily.     rOPINIRole (REQUIP) 1 MG tablet Take 1 tablet (1 mg total) by mouth daily. 90 tablet 3   Current Facility-Administered Medications  Medication Dose Route Frequency Provider Last Rate Last Admin   0.9 %  sodium chloride infusion  500 mL Intravenous Once Jasman Pfeifle, Willaim Rayas, MD       triamcinolone acetonide (KENALOG) 10 MG/ML injection 10 mg  10 mg Other Once Lenn Sink, DPM        Allergies as of 01/09/2023 - Review Complete 01/09/2023  Allergen Reaction Noted   Levofloxacin Rash 05/10/2002   Crestor [rosuvastatin]  07/23/2019   Hctz [hydrochlorothiazide] Nausea Only 07/31/2015    Family History  Problem Relation Age of Onset   Cancer Mother    Ovarian cancer Mother    Cancer Father    Liver cancer Father     Social History   Socioeconomic History   Marital status: Married    Spouse name: Not  on file   Number of children: Not on file   Years of education: Not on file   Highest education level: Not on file  Occupational History   Not on file  Tobacco Use   Smoking status: Former    Current packs/day: 0.00    Types: Cigarettes    Quit date: 09/22/1988    Years since quitting: 34.3   Smokeless tobacco: Never  Vaping Use   Vaping status: Never Used  Substance and Sexual Activity   Alcohol use: Yes    Alcohol/week: 0.0 standard drinks of alcohol    Comment: sev times a week- wine, liquors    Drug use: No   Sexual activity: Not on file  Other Topics Concern   Not on file  Social History Narrative   Not on file   Social Determinants of Health   Financial Resource Strain: Not on file  Food Insecurity: Not on file  Transportation Needs: Not on file  Physical Activity: Not on  file  Stress: Not on file  Social Connections: Not on file  Intimate Partner Violence: Not on file    Review of Systems: All other review of systems negative except as mentioned in the HPI.  Physical Exam: Vital signs BP (!) 113/97   Pulse 76   Temp 97.9 F (36.6 C) (Skin)   Ht 5' 6.25" (1.683 m)   Wt 179 lb (81.2 kg)   SpO2 99%   BMI 28.67 kg/m   General:   Alert,  Well-developed, pleasant and cooperative in NAD Lungs:  Clear throughout to auscultation.   Heart:  Regular rate and rhythm Abdomen:  Soft, nontender and nondistended.   Neuro/Psych:  Alert and cooperative. Normal mood and affect. A and O x 3  Harlin Rain, MD [facility]

## 2023-01-09 NOTE — Progress Notes (Signed)
Sedate, gd SR, tolerated procedure well, VSS, report to RN 

## 2023-01-09 NOTE — Op Note (Signed)
[facility] Patient Name: Crystal Blake Procedure Date: 01/09/2023 3:52 PM MRN: [id number] Endoscopist: Viviann Spare P. Adela Lank , MD, [id number] Age: 67 Referring MD:  Date of Birth: [birth year] Gender: Female Account #: [id number] Procedure:                Colonoscopy Indications:              High risk colon cancer surveillance: Personal                            history of colonic polyps - adenoma removed 07/2015 Medicines:                Monitored Anesthesia Care Procedure:                Pre-Anesthesia Assessment:                           - Prior to the procedure, a History and Physical                            was performed, and patient medications and                            allergies were reviewed. The patient's tolerance of                            previous anesthesia was also reviewed. The risks                            and benefits of the procedure and the sedation                            options and risks were discussed with the patient.                            All questions were answered, and informed consent                            was obtained. Prior Anticoagulants: The patient has                            taken no anticoagulant or antiplatelet agents. ASA                            Grade Assessment: II - A patient with mild systemic                            disease. After reviewing the risks and benefits,                            the patient was deemed in satisfactory condition to                            undergo the procedure.  After obtaining informed consent, the colonoscope                            was passed under direct vision. Throughout the                            procedure, the patient's blood pressure, pulse, and                            oxygen saturations were monitored continuously. The                            [id number]-HQ190L Colonoscope [id number] was introduced                            through the  anus and advanced to the the terminal                            ileum, with identification of the appendiceal                            orifice and IC valve. The colonoscopy was performed                            without difficulty. The patient tolerated the                            procedure well. The quality of the bowel                            preparation was good. The ileocecal valve,                            appendiceal orifice, and rectum were photographed. Scope In: 4:01:56 PM Scope Out: 4:22:42 PM Scope Withdrawal Time: 0 hours 16 minutes 4 seconds  Total Procedure Duration: 0 hours 20 minutes 46 seconds  Findings:                 The perianal and digital rectal examinations were                            normal.                           Two sessile polyps were found in the ascending                            colon. The polyps were 3 mm in size. These polyps                            were removed with a cold snare. Resection and                            retrieval were complete.  The terminal ileum appeared normal.                           Two sessile polyps were found in the transverse                            colon. The polyps were 3 mm in size. These polyps                            were removed with a cold snare. Resection and                            retrieval were complete.                           A 3 mm polyp was found in the descending colon. The                            polyp was sessile. The polyp was removed with a                            cold snare. Resection and retrieval were complete.                           Many small-mouthed diverticula were found in the                            transverse colon and left colon. Severe in the left                            colon with restricted mobility - pediatric                            colonoscope used to perform this exam.                           Internal  hemorrhoids were found during retroflexion.                           The exam was otherwise without abnormality. Complications:            No immediate complications. Estimated blood loss:                            Minimal. Estimated Blood Loss:     Estimated blood loss was minimal. Impression:               - Two 3 mm polyps in the ascending colon, removed                            with a cold snare. Resected and retrieved.                           - The examined portion of the ileum was normal.                           -  Two 3 mm polyps in the transverse colon, removed                            with a cold snare. Resected and retrieved.                           - One 3 mm polyp in the descending colon, removed                            with a cold snare. Resected and retrieved.                           - Diverticulosis in the transverse colon and in the                            left colon.                           - Internal hemorrhoids.                           - The examination was otherwise normal. Recommendation:           - Patient has a contact number available for                            emergencies. The signs and symptoms of potential                            delayed complications were discussed with the                            patient. Return to normal activities tomorrow.                            Written discharge instructions were provided to the                            patient.                           - Resume previous diet.                           - Continue present medications.                           - Await pathology results. Viviann Spare P. Aleanna Menge, MD 01/09/2023 4:30:07 PM This report has been signed electronically.

## 2023-01-09 NOTE — Patient Instructions (Signed)
YOU HAD AN ENDOSCOPIC PROCEDURE TODAY AT THE [facility]:   Refer to the procedure report that was given to you for any specific questions about what was found during the examination.  If the procedure report does not answer your questions, please call your gastroenterologist to clarify.  If you requested that your care partner not be given the details of your procedure findings, then the procedure report has been included in a sealed envelope for you to review at your convenience later.  YOU SHOULD EXPECT: Some feelings of bloating in the abdomen. Passage of more gas than usual.  Walking can help get rid of the air that was put into your GI tract during the procedure and reduce the bloating. If you had a lower endoscopy (such as a colonoscopy or flexible sigmoidoscopy) you may notice spotting of blood in your stool or on the toilet paper. If you underwent a bowel prep for your procedure, you may not have a normal bowel movement for a few days.  Please Note:  You might notice some irritation and congestion in your nose or some drainage.  This is from the oxygen used during your procedure.  There is no need for concern and it should clear up in a day or so.  SYMPTOMS TO REPORT IMMEDIATELY:  Following lower endoscopy (colonoscopy or flexible sigmoidoscopy):  Excessive amounts of blood in the stool  Significant tenderness or worsening of abdominal pains  Swelling of the abdomen that is new, acute  Fever of 100F or higher    For urgent or emergent issues, a gastroenterologist can be reached at any hour by calling [phone]. Do not use MyChart messaging for urgent concerns.    DIET:  We do recommend a small meal at first, but then you may proceed to your regular diet.  Drink plenty of fluids but you should avoid alcoholic beverages for 24 hours.  MEDICATIONS: Continue present medications.  Please see handouts given to you by your recovery nurse: Polyps, Diverticulosis,  Hemorrhoids.  FOLLOW UP: Await pathology results.  Thank you for allowing [place] to provide for your healthcare needs today.  ACTIVITY:  You should plan to take it easy for the rest of today and you should NOT DRIVE or use heavy machinery until tomorrow (because of the sedation medicines used during the test).    FOLLOW UP: Our staff will call the number listed on your records the next business day following your procedure.  We will call around 7:15- 8:00 am to check on you and address any questions or concerns that you may have regarding the information given to you following your procedure. If we do not reach you, we will leave a message.     If any biopsies were taken you will be contacted by phone or by letter within the next 1-3 weeks.  Please call us at [phone] if you have not heard about the biopsies in 3 weeks.    SIGNATURES/CONFIDENTIALITY: You and/or your care partner have signed paperwork which will be entered into your electronic medical record.  These signatures attest to the fact that that the information above on your After Visit Summary has been reviewed and is understood.  Full responsibility of the confidentiality of this discharge information lies with you and/or your care-partner.

## 2023-01-10 ENCOUNTER — Telehealth: Payer: Self-pay

## 2023-01-10 NOTE — Telephone Encounter (Signed)
Follow up call placed, VM obtained and message left. 

## 2023-01-14 LAB — SURGICAL PATHOLOGY

## 2023-11-12 ENCOUNTER — Other Ambulatory Visit: Payer: Self-pay | Admitting: Registered Nurse

## 2023-11-12 DIAGNOSIS — R748 Abnormal levels of other serum enzymes: Secondary | ICD-10-CM

## 2023-11-25 ENCOUNTER — Encounter: Payer: Self-pay | Admitting: Registered Nurse

## 2023-11-28 ENCOUNTER — Ambulatory Visit
Admission: RE | Admit: 2023-11-28 | Discharge: 2023-11-28 | Disposition: A | Discharge status: Home / Self Care | Source: Ambulatory Visit | Attending: Registered Nurse | Admitting: Registered Nurse

## 2023-11-28 DIAGNOSIS — R748 Abnormal levels of other serum enzymes: Secondary | ICD-10-CM
